# Patient Record
Sex: Male | Born: 1950 | Race: White | Hispanic: No | Marital: Married | Smoking: Former smoker
Health system: Southern US, Community
[De-identification: ages and names within clinical notes are randomized; demographics above are authoritative.]

## PROBLEM LIST (undated history)

## (undated) DIAGNOSIS — I10 Essential (primary) hypertension: Secondary | ICD-10-CM

## (undated) DIAGNOSIS — E785 Hyperlipidemia, unspecified: Secondary | ICD-10-CM

## (undated) DIAGNOSIS — Z951 Presence of aortocoronary bypass graft: Secondary | ICD-10-CM

## (undated) DIAGNOSIS — I2119 ST elevation (STEMI) myocardial infarction involving other coronary artery of inferior wall: Secondary | ICD-10-CM

## (undated) DIAGNOSIS — J302 Other seasonal allergic rhinitis: Secondary | ICD-10-CM

## (undated) DIAGNOSIS — H8109 Meniere's disease, unspecified ear: Secondary | ICD-10-CM

## (undated) DIAGNOSIS — I2510 Atherosclerotic heart disease of native coronary artery without angina pectoris: Secondary | ICD-10-CM

## (undated) DIAGNOSIS — Z9861 Coronary angioplasty status: Secondary | ICD-10-CM

## (undated) DIAGNOSIS — I255 Ischemic cardiomyopathy: Principal | ICD-10-CM

## (undated) HISTORY — DX: Coronary angioplasty status: Z98.61

## (undated) HISTORY — DX: Ischemic cardiomyopathy: I25.5

## (undated) HISTORY — DX: Meniere's disease, unspecified ear: H81.09

## (undated) HISTORY — DX: Atherosclerotic heart disease of native coronary artery without angina pectoris: I25.10

## (undated) HISTORY — PX: CORONARY ARTERY BYPASS GRAFT: SHX141

## (undated) HISTORY — DX: Other seasonal allergic rhinitis: J30.2

## (undated) HISTORY — DX: Hyperlipidemia, unspecified: E78.5

## (undated) HISTORY — DX: Essential (primary) hypertension: I10

## (undated) HISTORY — DX: ST elevation (STEMI) myocardial infarction involving other coronary artery of inferior wall: I21.19

## (undated) HISTORY — DX: Presence of aortocoronary bypass graft: Z95.1

---

## 1990-10-03 DIAGNOSIS — I255 Ischemic cardiomyopathy: Secondary | ICD-10-CM

## 1990-10-03 HISTORY — DX: Ischemic cardiomyopathy: I25.5

## 1990-10-31 DIAGNOSIS — I10 Essential (primary) hypertension: Secondary | ICD-10-CM

## 1990-10-31 DIAGNOSIS — I2510 Atherosclerotic heart disease of native coronary artery without angina pectoris: Secondary | ICD-10-CM | POA: Insufficient documentation

## 1990-10-31 HISTORY — DX: Essential (primary) hypertension: I10

## 1998-09-19 ENCOUNTER — Encounter: Payer: Self-pay | Admitting: Emergency Medicine

## 1998-09-19 ENCOUNTER — Inpatient Hospital Stay (HOSPITAL_COMMUNITY): Admission: EM | Admit: 1998-09-19 | Discharge: 1998-09-30 | Payer: Self-pay | Admitting: Emergency Medicine

## 1998-10-26 ENCOUNTER — Ambulatory Visit (HOSPITAL_COMMUNITY): Admission: RE | Admit: 1998-10-26 | Discharge: 1998-10-26 | Payer: Self-pay | Admitting: Cardiology

## 1998-10-26 ENCOUNTER — Encounter: Payer: Self-pay | Admitting: Cardiology

## 1999-04-13 ENCOUNTER — Encounter: Payer: Self-pay | Admitting: Cardiology

## 1999-04-13 ENCOUNTER — Ambulatory Visit (HOSPITAL_COMMUNITY): Admission: RE | Admit: 1999-04-13 | Discharge: 1999-04-13 | Payer: Self-pay | Admitting: Cardiology

## 2004-04-04 DIAGNOSIS — I2510 Atherosclerotic heart disease of native coronary artery without angina pectoris: Secondary | ICD-10-CM

## 2004-04-04 HISTORY — PX: OTHER SURGICAL HISTORY: SHX169

## 2004-04-04 HISTORY — DX: Atherosclerotic heart disease of native coronary artery without angina pectoris: I25.10

## 2004-11-28 ENCOUNTER — Ambulatory Visit (HOSPITAL_BASED_OUTPATIENT_CLINIC_OR_DEPARTMENT_OTHER): Admission: RE | Admit: 2004-11-28 | Discharge: 2004-11-28 | Payer: Self-pay | Admitting: Otolaryngology

## 2004-12-06 ENCOUNTER — Ambulatory Visit: Payer: Self-pay | Admitting: Internal Medicine

## 2005-03-14 ENCOUNTER — Encounter: Admission: RE | Admit: 2005-03-14 | Discharge: 2005-03-14 | Payer: Self-pay | Admitting: Cardiology

## 2005-03-15 ENCOUNTER — Encounter: Admission: RE | Admit: 2005-03-15 | Discharge: 2005-03-15 | Payer: Self-pay | Admitting: Otolaryngology

## 2005-03-18 ENCOUNTER — Ambulatory Visit (HOSPITAL_COMMUNITY): Admission: RE | Admit: 2005-03-18 | Discharge: 2005-03-18 | Payer: Self-pay | Admitting: *Deleted

## 2005-03-18 HISTORY — PX: CORONARY ANGIOPLASTY WITH STENT PLACEMENT: SHX49

## 2005-04-04 HISTORY — PX: CARDIAC CATHETERIZATION: SHX172

## 2005-04-19 ENCOUNTER — Inpatient Hospital Stay (HOSPITAL_COMMUNITY): Admission: RE | Admit: 2005-04-19 | Discharge: 2005-04-24 | Payer: Self-pay | Admitting: Cardiothoracic Surgery

## 2005-05-09 ENCOUNTER — Encounter: Admission: RE | Admit: 2005-05-09 | Discharge: 2005-05-09 | Payer: Self-pay | Admitting: Cardiology

## 2006-02-02 DIAGNOSIS — Z951 Presence of aortocoronary bypass graft: Secondary | ICD-10-CM

## 2006-02-02 HISTORY — DX: Presence of aortocoronary bypass graft: Z95.1

## 2007-02-08 HISTORY — PX: NM MYOCAR PERF WALL MOTION: HXRAD629

## 2007-04-19 ENCOUNTER — Ambulatory Visit (HOSPITAL_BASED_OUTPATIENT_CLINIC_OR_DEPARTMENT_OTHER): Admission: RE | Admit: 2007-04-19 | Discharge: 2007-04-19 | Payer: Self-pay | Admitting: Orthopedic Surgery

## 2007-07-08 IMAGING — CR DG CHEST 2V
2 series · 2 of 2 positions shown · non-contrast
Comparison: None.

CLINICAL DATA: Pre-cardiac catheterization. Chest pain and shortness of breath.
Prior coronary artery stenting.

CHEST - 2 VIEW  03/14/2005:

[view not recorded (1 of 2)]
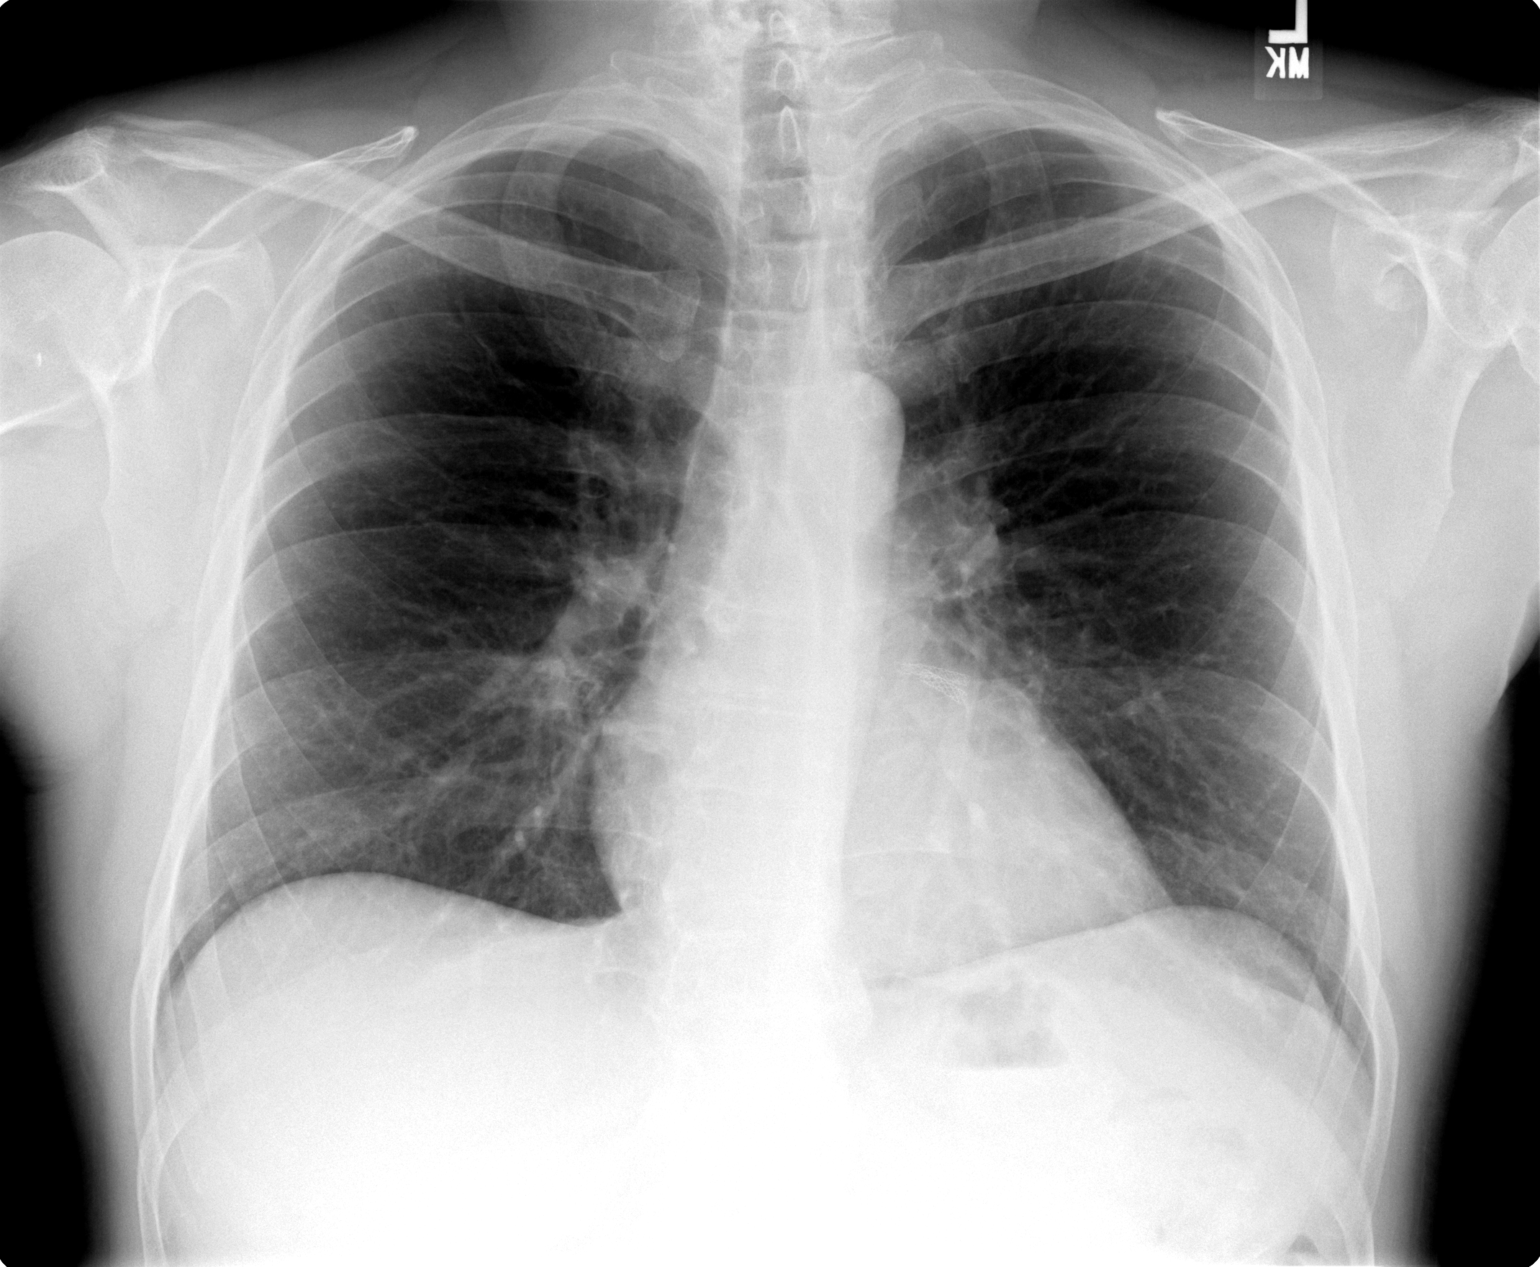

[view not recorded (2 of 2)]
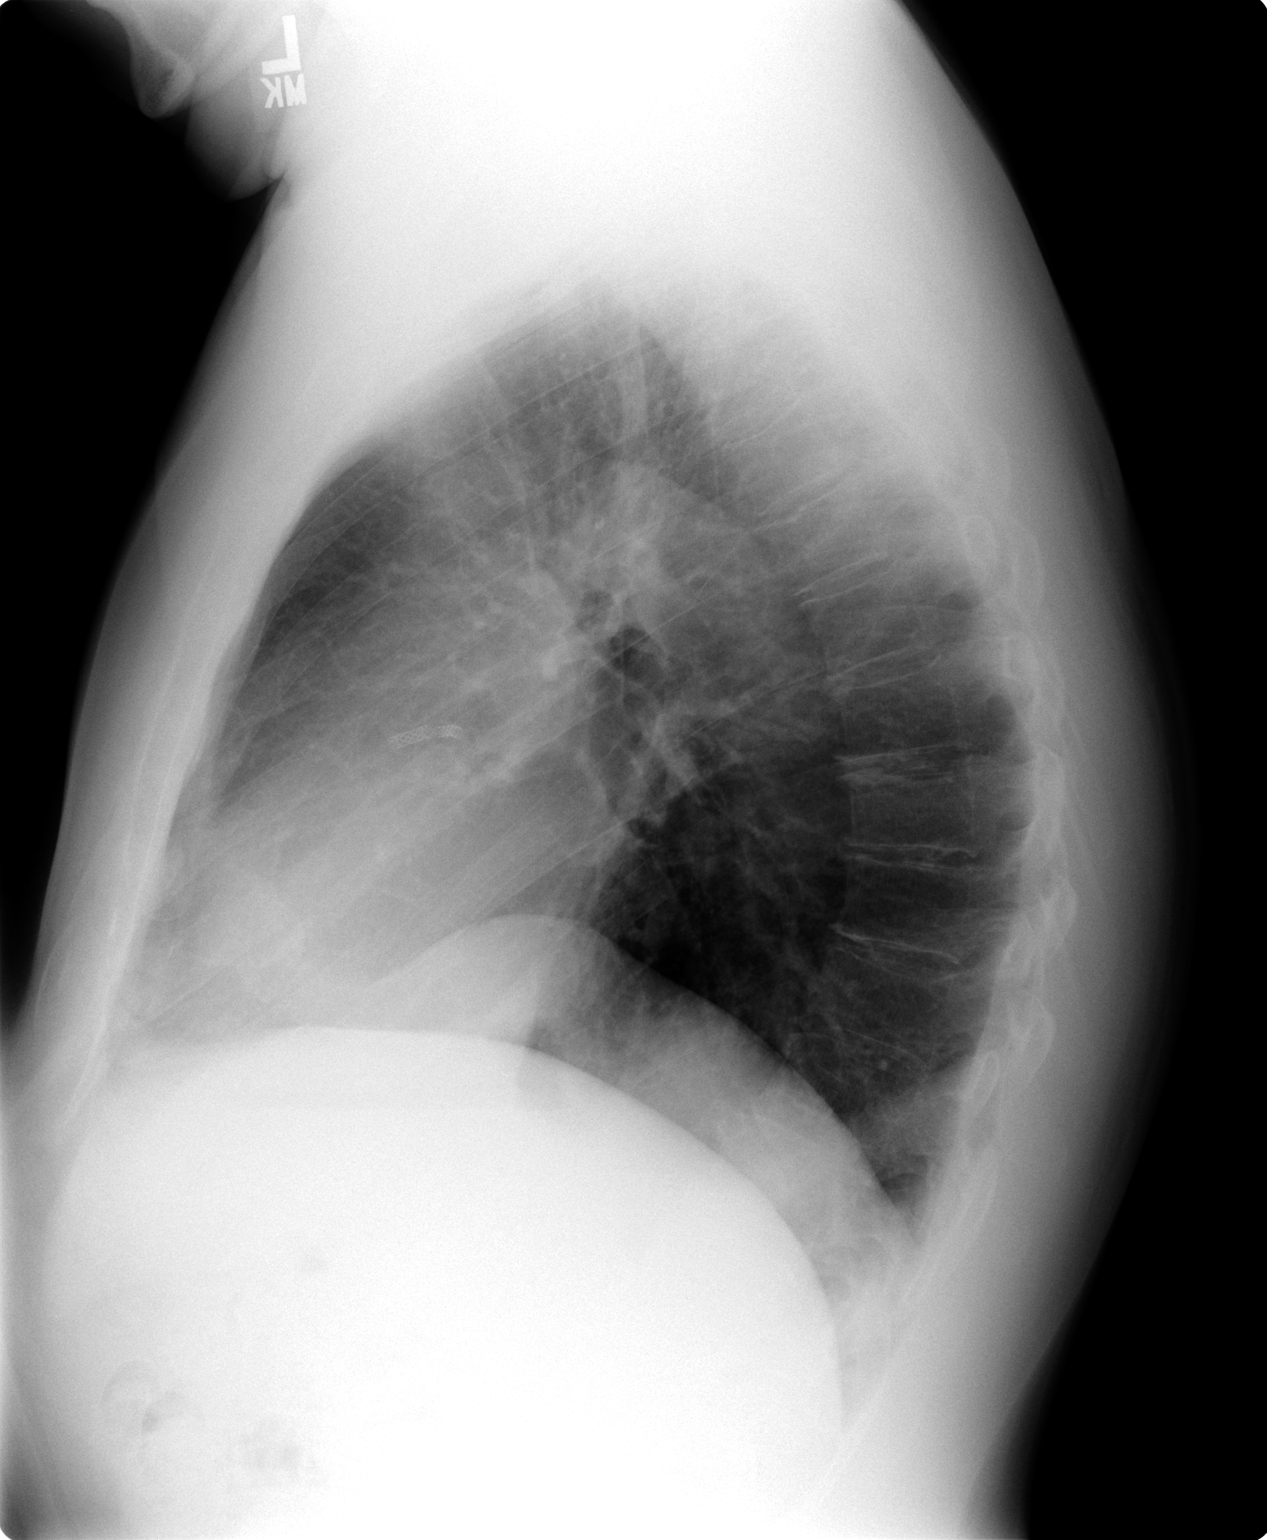

[2 of 2 positions shown; findings below may reference images not displayed]

FINDINGS: The heart size is normal. Stents are present in what I believe are
the circumflex and left anterior descending arteries. The lungs are clear. There
are no pleural effusions. Degenerative changes are present in the thoracic
spine.
IMPRESSION: No acute cardiopulmonary disease.

## 2007-07-09 IMAGING — CR DG ORBITS FOR FOREIGN BODY
2 series · 2 of 2 positions shown · non-contrast
Comparison: none

CLINICAL DATA: Metal removed years ago, pre MRI. 
 ORBITS: 
 Views of the orbits were obtained with the patient looking to the left and looking to the right.  No orbital metallic foreign body is seen.  No bony abnormality is noted.

[view not recorded (1 of 2)]
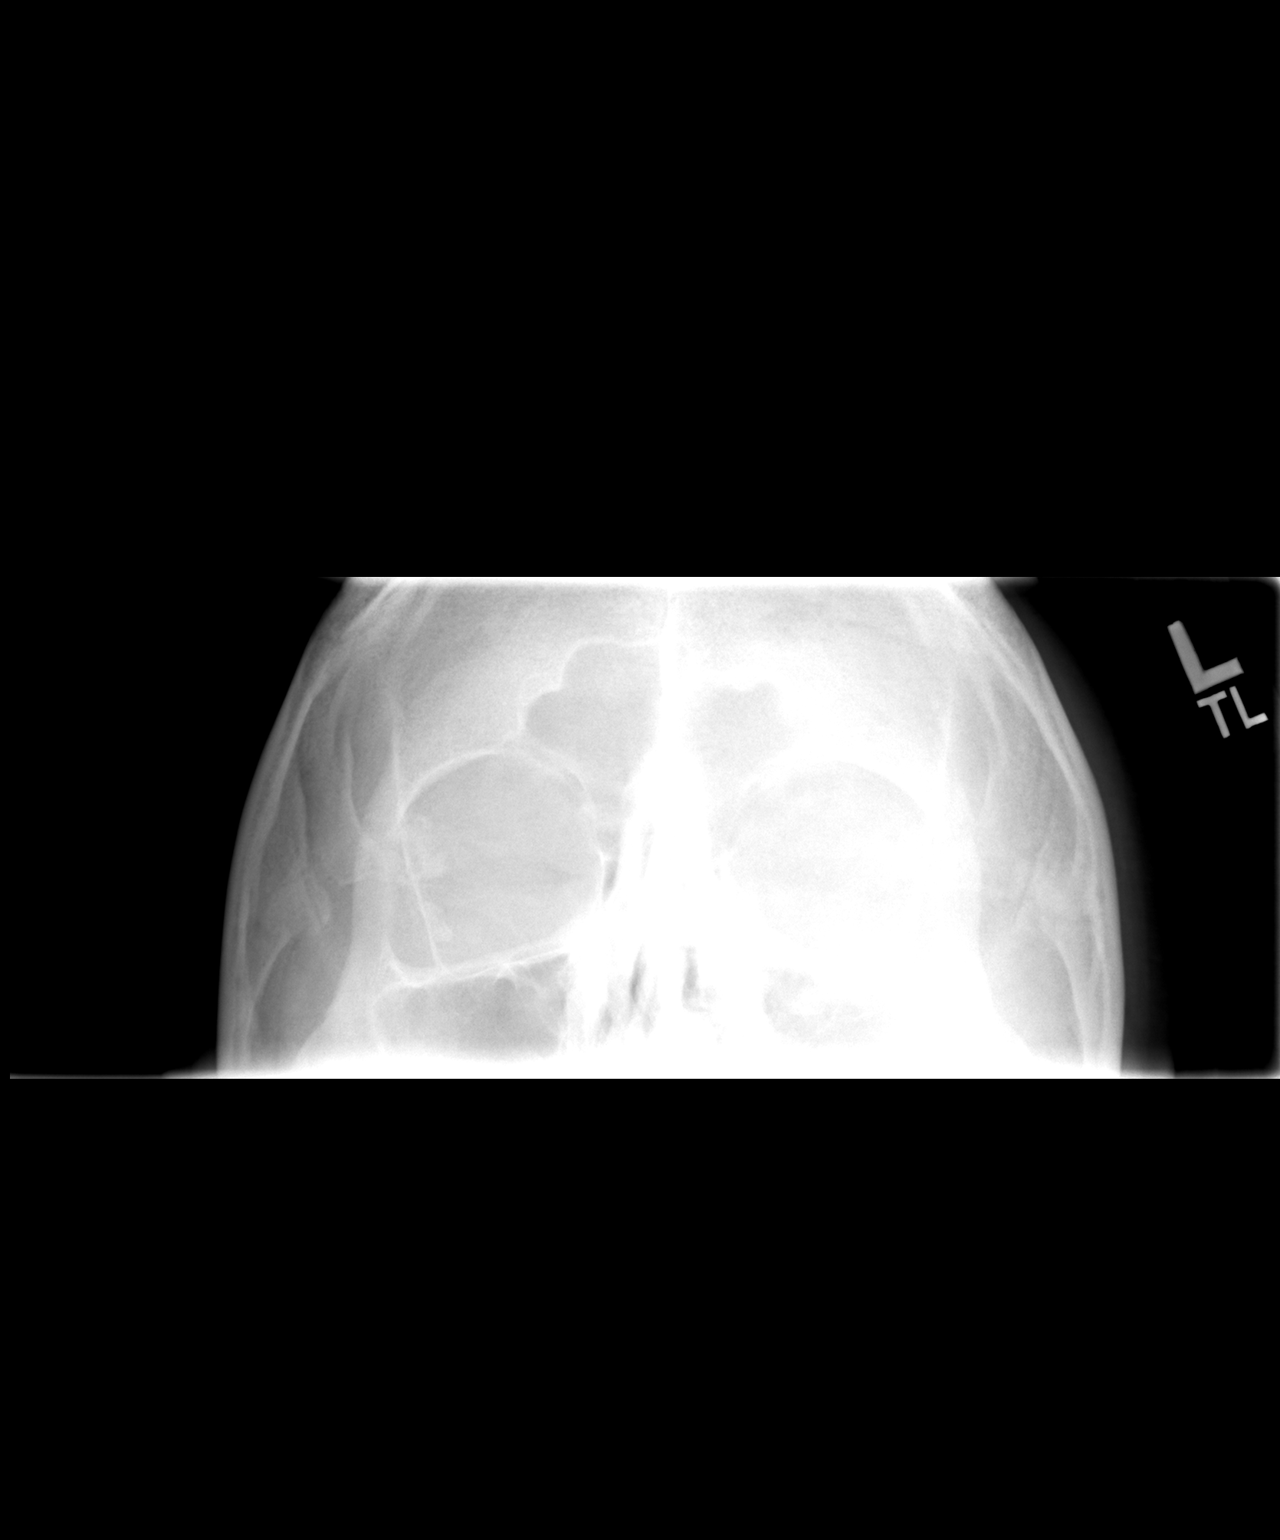

[view not recorded (2 of 2)]
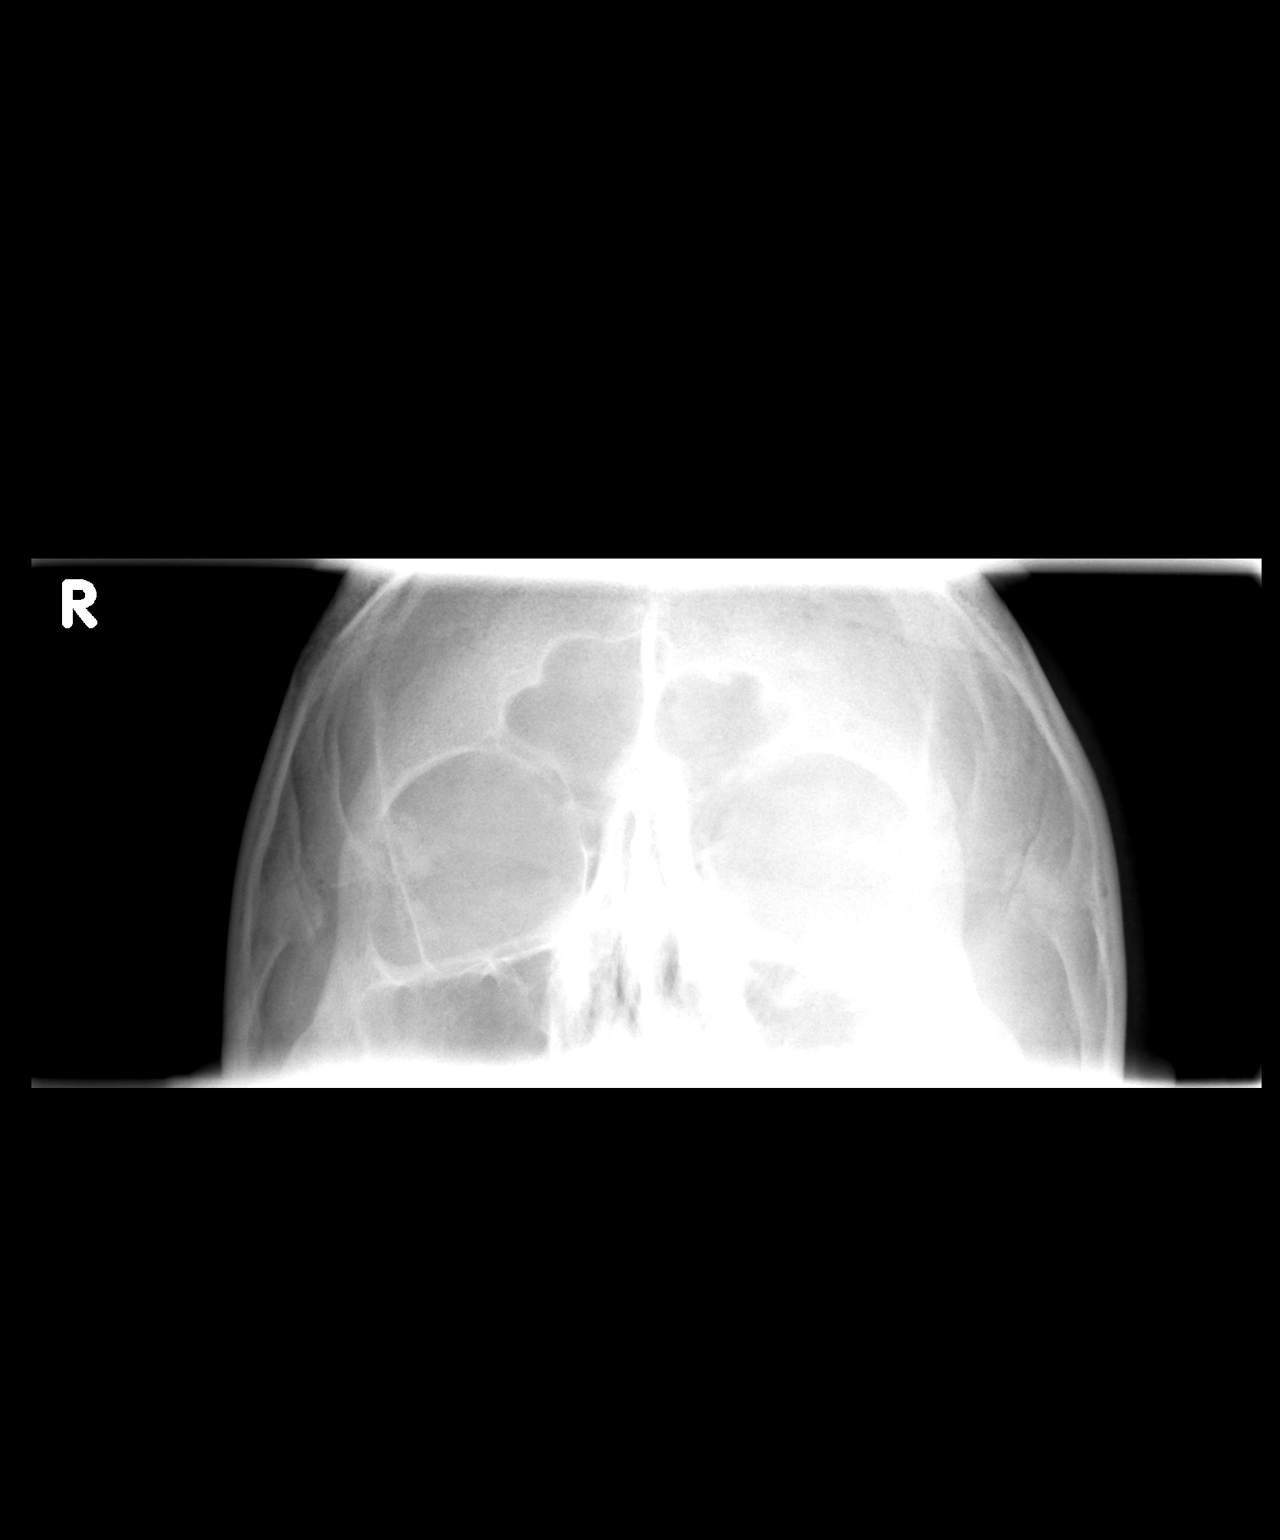

[2 of 2 positions shown; findings below may reference images not displayed]

IMPRESSION: No orbital metallic foreign body.

## 2009-04-28 ENCOUNTER — Encounter: Admission: RE | Admit: 2009-04-28 | Discharge: 2009-04-28 | Payer: Self-pay | Admitting: Sports Medicine

## 2010-04-25 ENCOUNTER — Encounter: Payer: Self-pay | Admitting: Sports Medicine

## 2010-08-17 NOTE — Op Note (Signed)
NAME:  Ricky Ali, Ricky Ali NO.:  [id number]   MEDICAL RECORD NO.:  [id number]          PATIENT TYPE:  AMB   LOCATION:  DSC                          FACILITY:  [facility]   PHYSICIAN:  Loreta Ave, M.D. DATE OF BIRTH:  1951-01-23   DATE OF PROCEDURE:  04/19/2007  DATE OF DISCHARGE:                               OPERATIVE REPORT   PREOPERATIVE DIAGNOSIS:  Left shoulder degenerative arthritis,  subacromial impingement, partial tearing rotator cuff, distal clavicle  osteolysis, loose bodies in the shoulder.   POSTOPERATIVE DIAGNOSIS:  Left shoulder degenerative arthritis,  subacromial impingement, partial tearing rotator cuff, distal clavicle  osteolysis, loose bodies in the shoulder.  Grade 3 and 4 degenerative  joint disease glenohumeral joint, marked complex degenerative tearing  labrum.  Extensive intra-articular portion of biceps tendon tearing.  Two large osteochondral loose bodies, one almost a centimeter and a half  in diameter.  Tendinopathy but no full-thickness tears rotator cuff.  Subacromial bursitis, impingement distal clavicle osteolysis.   PROCEDURE:  Left shoulder exam under anesthesia, arthroscopy with  debridement glenohumeral joint including articular cartilage and labrum.  Removal of two large loose bodies with large grasping forceps.  Debridement rotator cuff.  Resection of intra-articular portion biceps  tendon for later tenodesis.  Subacromial decompression with  acromioplasty, distal clavicle excision and coracoacromial ligament  release.  Open biceps reimplantation interbicipital groove with  FiberWire suture.   SURGEON:  Loreta Ave, M.D.   ASSISTANT:  Genene Churn. Denton Meek., present throughout the entire case.   ANESTHESIA:  General.   BLOOD LOSS:  Minimal.   SPECIMENS:  None.   COMPLICATIONS:  None.   DRESSING:  Soft compressive sling.   PROCEDURE:  The patient brought to the operating room, placed on the  operating table in  supine position.  After adequate anesthesia had been  obtained, left shoulder examined.  Reasonable not quite full motion, but  consistent with degenerative arthritis.  No adhesions.  Placed in beach-  chair position on a shoulder positioner and prepped and draped in the  usual sterile fashion.  Three portals anterior posterior lateral.  Shoulder entered with blunt obturator.  Arthroscope introduced.  The  shoulder distended and inspected.  Extensive grade 3 and 4 changes.  Loose bodies removed.  Circumferential tearing labrum debrided.  Although tendinopathy of the supraspinatus, infraspinatus and subscap  tendon, there were no full-thickness tears.  Intra-articular portion of  biceps was markedly degenerative and essentially just about to tear off.  The intra-articular portion debrided for later extra-articular  tenodesis.  Two large loose bodies were then removed from the large  grasping forceps and brought out from the shoulder.  Cannula redirected  subacromially.  Type 3 acromion.  Reactive bursitis.  Abrasive changes  on top of the cuff but no full-thickness tears.  Cuff debrided.  Acromioplasty to a type 1 acromion.  CA ligament release.  Distal  clavicle grade 4 changes.  Periarticular spurs lateral centimeter of  clavicle resected.  Adequacy of decompression and debridement confirmed  viewing from all portals.  Instruments and fluid removed.  Incision was  made over the anterior aspect of the shoulder, deltoid splitting,  staying above the nerve.  Blunt dissection used to open up the deltoid  exposing the bicipital groove.  Biceps tendon brought out,  tear cut off  to appropriate length and then captured with a weave FiberWire suture.  Reimplanted into the middle of the bicipital groove and large drill hole  with the suture brought in that and then out two small exiting drill  holes.  They were pulled tightly bringing the biceps down into a large  hole and then sutures tied over  top to ensure reimplantation.  Wound  irrigated.  Deltoid closed with Vicryl.  Portals closed with nylon.  Incision closed subcutaneous subcuticular Vicryl.  Steri-Strips applied.  Sterile compressive dressing applied.  Sling applied.  Anesthesia  reversed.  Brought to the recovery room.  Tolerated surgery well.  No  complications.      Loreta Ave, M.D.  Electronically Signed     DFM/MEDQ  D:  04/19/2007  T:  04/19/2007  Job:  [id number]

## 2010-08-20 NOTE — Op Note (Signed)
NAME:  Ricky Ali, Ricky Ali NO.:  [id number]   MEDICAL RECORD NO.:  [id number]          PATIENT TYPE:  INP   LOCATION:  2001                         FACILITY:  [facility]   PHYSICIAN:  Sheliah Plane, MD    DATE OF BIRTH:  1950-08-14   DATE OF PROCEDURE:  04/19/2005  DATE OF DISCHARGE:                                 OPERATIVE REPORT   PREOPERATIVE DIAGNOSIS:  Recurrent coronary occlusive disease, status post  coronary stent placement.   POSTOPERATIVE DIAGNOSIS:  Recurrent coronary occlusive disease, status post  coronary stent placement.   SURGICAL PROCEDURES:  Coronary artery bypass grafting x5 with the left  internal mammary to left anterior descending coronary artery, right radial  artery to the diagonal coronary artery, right internal mammary to the first  obtuse marginal via the transverse sinus as a pedicle graft, sequential  reversed saphenous vein graft to the posterior descending and posterolateral  branches of the right coronary artery with right thigh endo-vein harvesting.   SURGEON:  Sheliah Plane, MD   FIRST ASSISTANT:  Gershon Crane, [place]   BRIEF HISTORY:  The patient is a 60 year old male who at age 82 suffered an  inferior myocardial infarction and was treated with angioplasty of the right  coronary artery.  Subsequently, he returned with disease in the LAD and a  large first diagonal.  Stents were placed in these vessels in the past.  However, the patient began having recurrent symptoms.  Repeat cardiac  catheterization revealed significant disease, restenosis both stents,  progression of disease in a large first obtuse marginal of greater than 80%.  He had diffuse disease throughout the right coronary artery with evidence of  inferior hypokinesis.  Redo coronary artery bypass grafting was recommended  to the patient, who agreed and signed informed consent.   DESCRIPTION OF PROCEDURE:  With Swan-Ganz and arterial line monitoring  placed, the patient  underwent general endotracheal anesthesia without  incidence.  The skin of the chest and legs was prepped with Betadine and  draped in usual sterile manner.  The patient is left-handed and had a patent  palmar arch on the right.  It was decided to harvest the right internal  mammary artery and also the left internal mammary artery and the radial  artery on the right.  Initially, using a curvilinear incision across the  volar aspect of the right forearm, dissection was carried down to the radial  artery, which was identified and dissected open with an open technique to  just proximal to the antecubital fossa.  The artery appeared to be of good  quality and caliber and using a harmonic scalpel, the side branches of the  radial artery were each divided.  Prior to dividing the artery, 2500 units  of heparin were administered to the patient.  The artery was divided and  flushed with warm heparinized blood.  The radial artery was excellent  quality.  Proximally and distally, the artery was ligated with silk sutures.  The arm was then closed with a running 2-0 Ethibond suture and a 4-0  subcuticular stitch in the skin edges.  Dry dressing was applied and the arm  was tucked.  A median sternotomy was performed and both the right and left  internal mammary arteries were dissected down as pedicle grafts and were of  good quality and caliber.  Using a [employer] endo-vein harvesting system, a  segment of vein was harvested from the right thigh and was slightly small,  but of good quality.  The pericardium was then opened.  The patient was  systemically heparinized.  The ascending aorta and the right atrium were  cannulated and aortic root vent cardioplegia needle was introduced into the  ascending aorta.  On examination of the heart, the patient had evidence of  old inferior scarring consistent with old inferior myocardial infarction.  His body temperature was cooled to 30 degrees, aortic crossclamp was  applied  and 500 mL of cold blood potassium cardioplegia was administered with rapid  diastolic arrest the heart.  Attention was turned first to the right system.  The PD the PL were identified.  Each was opened and were small, diffusely  diseased vessels, but did admit 1-mm probes.  A diamond-type side-to-side  anastomosis was carried out with a running 8-0 Prolene.  Using a running 8-0  Prolene, distal anastomosis was performed.  Additional cold blood  cardioplegia was administered down the vein graft.  Attention was then  turned to the first obtuse marginal vessel.  The right internal mammary  artery was brought through the transverse sinus behind the aorta and easily  reached to the obtuse marginal vessel.  The vessel was opened and admitted a  1.5-mm probe.  Using a running 8-0 Prolene, the right internal mammary  artery was anastomosed to the obtuse marginal coronary artery.  The bulldog  on the mammary artery was flashed and there was no evidence of anastomotic  bleeding. The right radial arterywas used to bypass the large first diagonal  vessel.  This vessel was opened and was 1.5 mm in size.  Using a running 8-0  Prolene, the radial artery was anastomosed to the diagonal coronary artery.  The left internal mammary artery was then anastomosed to left anterior  descending coronary artery, which was opened in the mid portion and was a  reasonable-sized vessel.  It admitted a 1.5-mm probe distally.  Using a  running 8-0 Prolene, the left internal mammary artery was anastomosed to the  left anterior descending coronary artery.  With the crossclamp still in  place, 2 punch aortotomies were created on the ascending aorta.  The radial  artery was trimmed to the appropriate length and anastomosed directly to the  ascending aorta with a running 7-0 Prolene.  The vein graft to the right cornory system was anastomosed to the acending aorta.  Air was evacuated  from the heart and from the  ascending aorta and the aortic crossclamp was  removed with total crossclamp time 119 minutes.  The patient returned to a  sinus rhythm.  Atrial and ventricular pacing wires were applied.  Prior to  removal of the crossclamp, the bulldogs on the mammary arteries had been  removed and there was evidence of prompt rise in myocardial septal  temperature.  With the patient's body temperature rewarmed to 37 degrees, he  was then ventilated and weaned from bypass. Protamine was administrated.  Whith the operative field hemostatic,Two atrial and 2 ventricular pacing  wires were applied, graft markers applied, left and right pleural tubes, 2  mediastinal tubes.  Pericardium was reapproximated.  Sternum was closed with  #  6 stainless steel wire, fascia closed with interrupted 0 Vicryl and 3-0  Vicryl in subcutaneous tissue, 4-0 subcuticular stitch in the skin edges.  Dry dressings were applied.  Sponge and needle count was reported as correct  at completion of the procedure.  The patient tolerated the procedure without  obvious complication and was transferred to the surgical intensive care unit  for further postoperative care.  He tolerated the procedure without obvious  complication.  Total pump time was 163 minutes.      Sheliah Plane, MD  Electronically Signed     EG/MEDQ  D:  04/22/2005  T:  04/23/2005  Job:  [id number]   cc:   Thereasa Solo. Little, M.D.  Fax: [phone]

## 2010-08-20 NOTE — Procedures (Signed)
NAME:  Ricky Ali, Ricky Ali NO.:  [id number]   MEDICAL RECORD NO.:  [id number]          PATIENT TYPE:  OUT   LOCATION:  SLEEP CENTER                 FACILITY:  [facility]   PHYSICIAN:  Clinton D. Maple Hudson, M.D. DATE OF BIRTH:  23-Aug-1950   DATE OF STUDY:  11/28/2004                              NOCTURNAL POLYSOMNOGRAM   REFERRING PHYSICIAN:  Zola Button T. [facility], M.D.   Victorio Palm OF STUDY:  November 28, 2004.   INDICATIONS FOR STUDY:  Hypersomnia with sleep apnea. Epworth sleepiness  score 05/24, BMI 25. Weight 180 pounds.   SLEEP ARCHITECTURE:  Total sleep time 323 minutes. Sleep efficiency 85%.  Stage I 5%, stage II 58%, stages  III and IV 27%, REM 9% of total sleep  time. Sleep latency 2 minutes, REM latency 101 minutes, awake after sleep  onset 55 minutes, arousal index increased at 38. No bedtime medication  taken.   RESPIRATORY DATA:  Apnea hypopnea index (AHI, RDI) 4.3 obstructive events  per hour which is within normal adult limits. There were 9 obstructive  apneas, 3 central apneas, and 11 hypopneas. Events were not positional. REM  AHI was 12.2 per hour.   OXYGEN DATA:  Moderate to loud snoring with oxygen desaturation to a nadir  of 87%. Mean oxygen saturation through the study was 95% on room air.   CARDIAC DATA:  Normal sinus rhythm with occasional PVCs.   MOVEMENT/PARASOMNIA:  A total of 167 limb jerks were reported of which 106  were associated with arousal or awakening for periodic limb movement with  arousal index of 19.7 per hour which is abnormal.   IMPRESSION/RECOMMENDATIONS:  1.  Occasional sleep disordered breathing events, AHI 4.3 per hour which is      within normal limits with moderately loud snoring and oxygen      desaturation briefly to 87%.  2.  Periodic limb movement with arousal, 19.7 per hour. Consider specific      therapy such as clonazepam or Requip if clinically appropriate.     Clinton D. Maple Hudson, M.D.  Diplomate, Biomedical engineer of Sleep  Medicine  Electronically Signed    CDY/MEDQ  D:  12/05/2004 12:05:01  T:  12/05/2004 17:42:05  Job:  [id number]

## 2010-08-20 NOTE — Discharge Summary (Signed)
NAME:  FAHD, GALEA NO.:  [id number]   MEDICAL RECORD NO.:  [id number]          PATIENT TYPE:  INP   LOCATION:  2001                         FACILITY:  [facility]   PHYSICIAN:  Sheliah Plane, MD    DATE OF BIRTH:  Nov 25, 1950   DATE OF ADMISSION:  04/19/2005  DATE OF DISCHARGE:                                 DISCHARGE SUMMARY   ADMISSION DIAGNOSIS:  Recurrent angina.   PAST MEDICAL HISTORY AND DISCHARGE DIAGNOSES:  1.  Meniere's disease.  2.  Hypertension.  3.  Hyperlipidemia.  4.  Coronary artery disease, status post myocardial infarction in 1992,      status post multiple angioplasty and stents, status post coronary artery      bypass grafting x5.  5.  Postoperative anemia.  6.  Postoperative thrombocytopenia with a negative HIT screen.   PAST SURGICAL HISTORY:  Status post left shoulder surgery.   ALLERGIES:  No known drug allergies.   BRIEF HISTORY:  The patient is a 60 year old male who has been followed by  Dr. Clarene Duke with a known history of coronary occlusive disease.  This history  began in 1992, at which time he had an acute myocardial infarction involving  the right coronary artery.  He underwent angioplasty at that time and the  vessel was reopened again in June 2000 with a stent.  The patient presented  with recurrent angina and had four additional stents placed in three  arteries over a two-day period.  The patient did well until he noted  recurrent anginal symptoms occasionally at rest in July and again in  November.  Secondary to his recurrent symptoms, another cardiac  catheterization was performed on April 19, 2005, by Dr. Jenne Campus, which  revealed significant three-vessel coronary artery disease which progressed  in spite of placement of stents.  In light of this, the patient was referred  to CVTS.  Dr. Tyrone Sage evaluated the patient in the office, and it was his  opinion that the patient should proceed with surgical  revascularization.   HOSPITAL COURSE:  The patient was admitted and taken to the OR on April 19, 2005, for coronary artery bypass grafting x5.  The left internal mammary  artery was grafted to the LAD, the right internal mammary artery was grafted  to OM-1, the right radial artery was grafted to the diagonal, and a  saphenous vein was grafted in sequence to the PD and PL.  Endoscopic graft  harvesting was performed via the right thigh.  The patient tolerated the  procedure well and was hemodynamically stable immediately postoperatively.  The patient was transferred from the OR to the SICU in stable condition.  The patient was extubated without complications and woke up from anesthesia  neurologically intact.   The patient's postoperative course has been relatively uneventful.  On  postoperative day #1, he was afebrile with stable vital signs and  maintaining a normal sinus rhythm.  All invasive lines and chest tubes were  discontinued in a routine manner and the patient tolerated this well.  The  patient has been volume-overloaded postoperatively and has  been diuresed  accordingly.  On postoperative day 1, the patient was noted to be anemic.  This has been monitored closely with daily CBC levels.  He has subsequently  been started on iron and folic acid, and these will continue to be  monitored.  The patient has also been noted to be thrombocytopenic.  He is  currently trying to avoid any transfusion of blood products.  These are  currently stable at this time and, again, are being monitored closely.   The patient began cardiac rehab on postoperative day #1 and initially did  not tolerate this well; however, he is continuing to progress and is doing  quite well at this time.  Secondary to the patient's thrombocytopenia, a HIT  screen was performed and this was found to be negative.   On postoperative day #3, the patient is without complaints.  He is afebrile  with stable vital signs  and maintaining a normal sinus rhythm.  His chest x-  ray reveals a small left pleural effusion.  On physical exam, cardiac is  regular rate and rhythm.  The lungs reveal decreased breath sounds at the  bases.  The abdomen is benign.  The incisions are clean, dry, and intact,  and there is mild edema present in the extremities.  Motor and sensation are  intact in the right arm.  There is mild ecchymosis present in both the right  arm and right lower extremity.  The patient is in stable condition at this  time and as long as he continues to progress and this thrombocytopenia and  anemia remain stable, the patient will be ready for discharge within the  next one to two days pending morning round reevaluation.   LABORATORY DATA:  CBC and BMP on April 22, 2005:  White count 5.6,  hemoglobin 7.9, hematocrit 22.2, platelets 72.  Sodium 135, potassium 4.3,  BUN 9, creatinine 0.8, glucose 99.  HIT screen negative.   CONDITION ON DISCHARGE:  Improved.   INSTRUCTIONS:   MEDICATIONS:  1.  Aspirin 81 mg daily.  2.  Toprol XL 25 mg daily.  3.  Vytorin 10/20 mg daily.  4.  Imdur 30 mg daily.  5.  Xanax 0.25 mg q.6h. p.r.n. anxiety.  6.  Folic acid 1 mg daily.  7.  Niferex 150 mg daily.  8.  Tylox one to two q.6h. p.r.n. pain.   ACTIVITY:  No driving, no lifting more than 10 pounds for three weeks, and  the patient should continue his daily breathing and walking exercises   DIET:  Low salt, low fat.   WOUND CARE:  The patient may shower daily and clean the incisions with soap  and water.  If wound problems arise, he should contact the CVTS office.   FOLLOW-UP:  1.  Appointment with Dr. Clarene Duke.  The patient should contact his office for      an appointment two weeks after discharge, at which time he will have a      chest x-ray taken.  2.  With Dr. Laneta Simmers for Dr. Tyrone Sage on May 17, 2005, at 11:15 a.m.      Pecola Leisure, [place]      Sheliah Plane, MD  Electronically  Signed   AY/MEDQ  D:  04/22/2005  T:  04/23/2005  Job:  [id number]   cc:   Thereasa Solo. Little, M.D.  Fax: [phone]

## 2010-08-20 NOTE — Cardiovascular Report (Signed)
NAME:  Ricky Ali, Ricky Ali NO.:  [id number]   MEDICAL RECORD NO.:  [id number]          PATIENT TYPE:  OIB   LOCATION:  2860                         FACILITY:  [facility]   PHYSICIAN:  Darlin Priestly, MD  DATE OF BIRTH:  1950-04-28   DATE OF PROCEDURE:  03/18/2005  DATE OF DISCHARGE:  03/18/2005                              CARDIAC CATHETERIZATION   PROCEDURE:  1.  Left heart catheterization.  2.  Coronary angiography.  3.  Left ventriculogram.   ATTENDING PHYSICIAN:  Darlin Priestly, M.D.   COMPLICATIONS:  None.   INDICATIONS FOR PROCEDURE:  Mr. Tantillo is a 60 year old male patient of Dr.  Julieanne Manson and Dr. Dara Hoyer, with a history of CAD status post PTCA  and stenting of a high first diagonal, proximal LAD, and multiple stents of  the RCA.  His last cath in June 2000 was stent of the LAD as well as  multiple stents of the RCA.  He recently saw Dr. Clarene Duke with a complaint of  crescendo angina similar to his previous chest pain.  He is now referred for  repeat catheterization.   DESCRIPTION OF PROCEDURE:  After giving informed written consent, the  patient was brought to the cardiac cath lab were the right and left groins  were shaved, prepped and draped in a sterile fashion.  ECG monitoring was  established.  Using modified Seldinger technique, a 6 French arterial sheath  was inserted in the right femoral artery.  6 French diagnostic catheters  were used to perform diagnostic angiography.   The left main is a medium size vessel with distal 20% narrowing.   The LAD is a medium size vessel which gives rise to one high diagonal  branch.  The LAD is noted to have a stent in its proximal segment.  There  appears to be 50-60% in stent restenosis with haziness in the stented  segment.  There was a 70% lesion just beyond the LAD stent.  The remainder  of LAD does not appear to have any significant disease.  The high first  diagonal also has a stent in its  proximal portion which, as well, appears to  have approximately 70% in stent restenosis with a diffuse haziness.  There  is an 80% lesion just beyond that stent.   The left coronary artery gives rise to a medium size ramus intermedius with  50-60% proximal disease.   The left circumflex is a medium size vessel which courses to the AV groove  and gives rise to two obtuse marginal branches.  The AV groove circumflex  has no significant disease.  The first OM is a medium size vessel with 90%  ostial lesion.  The second OM is a small vessel with no significant disease.   The right coronary artery is a large vessel and is dominant and gives rise  to PDA and posterolateral branch.  There is a diffuse aneurysmal dilatation  throughout the RCA with 50% proximal, 70% mid segment lesion.  There was a  stent noted in the mid portion of the RCA with 60% in  stent restenosis which  also appears to be hazy.  There is a stent also noted in the distal portion  of the RCA with 60% in-stent restenosis.  The PDA and posterolateral  branches are small to medium size vessels, both of which have 80% proximal  lesions.   Left ventriculogram reveals mildly depressed EF of 40-45% with mild global  hypokinesis.  There appears to be more pronounced inferior and posterobasal  hypokinesis.   HEMODYNAMICS:  Systemic arterial pressure 140/87, LV systolic pressure  139/10, LVEDP 19.   CONCLUSION:  1.  Significant three vessel coronary artery disease.  2.  Moderately depressed LV systolic function.  3.  Systemic hypertension.  4.  Elevated LVE.      Darlin Priestly, MD  Electronically Signed     RHM/MEDQ  D:  03/18/2005  T:  03/21/2005  Job:  [id number]   cc:   Thereasa Solo. Little, M.D.  Fax: [phone]   Teena Irani. Arlyce Dice, M.D.  Fax: [phone]

## 2010-12-23 LAB — BASIC METABOLIC PANEL
Calcium: 9.5
Creatinine, Ser: 0.87
GFR calc Af Amer: >60
GFR calc non Af Amer: >60

## 2010-12-23 LAB — POCT HEMOGLOBIN-HEMACUE: Hemoglobin: 14.2

## 2012-10-02 DIAGNOSIS — H8109 Meniere's disease, unspecified ear: Secondary | ICD-10-CM

## 2012-10-02 HISTORY — DX: Meniere's disease, unspecified ear: H81.09

## 2012-10-29 ENCOUNTER — Encounter: Payer: Self-pay | Admitting: *Deleted

## 2012-10-30 ENCOUNTER — Ambulatory Visit: Payer: Self-pay | Admitting: Cardiology

## 2012-10-30 ENCOUNTER — Encounter: Payer: Self-pay | Admitting: Cardiology

## 2012-10-30 ENCOUNTER — Ambulatory Visit (INDEPENDENT_AMBULATORY_CARE_PROVIDER_SITE_OTHER): Payer: Managed Care, Other (non HMO) | Admitting: Cardiology

## 2012-10-30 VITALS — BP 90/69 | HR 80 | Ht 68.0 in | Wt 164.0 lb

## 2012-10-30 DIAGNOSIS — I10 Essential (primary) hypertension: Secondary | ICD-10-CM

## 2012-10-30 DIAGNOSIS — I255 Ischemic cardiomyopathy: Secondary | ICD-10-CM

## 2012-10-30 DIAGNOSIS — I2589 Other forms of chronic ischemic heart disease: Secondary | ICD-10-CM

## 2012-10-30 DIAGNOSIS — Z79899 Other long term (current) drug therapy: Secondary | ICD-10-CM

## 2012-10-30 DIAGNOSIS — H8102 Meniere's disease, left ear: Secondary | ICD-10-CM

## 2012-10-30 DIAGNOSIS — I2510 Atherosclerotic heart disease of native coronary artery without angina pectoris: Secondary | ICD-10-CM

## 2012-10-30 DIAGNOSIS — E785 Hyperlipidemia, unspecified: Secondary | ICD-10-CM | POA: Insufficient documentation

## 2012-10-30 DIAGNOSIS — H8109 Meniere's disease, unspecified ear: Secondary | ICD-10-CM | POA: Insufficient documentation

## 2012-10-30 DIAGNOSIS — Z9861 Coronary angioplasty status: Secondary | ICD-10-CM

## 2012-10-30 DIAGNOSIS — R0989 Other specified symptoms and signs involving the circulatory and respiratory systems: Secondary | ICD-10-CM

## 2012-10-30 DIAGNOSIS — R0609 Other forms of dyspnea: Secondary | ICD-10-CM | POA: Insufficient documentation

## 2012-10-30 NOTE — Assessment & Plan Note (Signed)
It's been 6 years since his last nuclear stress test. Been having some dyspnea on exertion, I'm concerned that he may very well as a graft dysfunction. He also has the ramus intermedius that was not bypassed.  Plan: Treadmill Myoview (Myocardial Perfusion Imaging)

## 2012-10-30 NOTE — Assessment & Plan Note (Signed)
Has never had an echocardiogram done. His last EF is roughly 40%.  He is on a pretty regimen of beta blocker and ACE inhibitor. No active heart failure symptoms to require any diuretic. He does however have mild dyspnea on exertion.  Plan: 2-D Echocardiogram, continue beta blocker and ACE inhibitor, however while on triamterene/HCTZ, will have him cut the Lisinopril dose in half versus stopped it due to hypotension.

## 2012-10-30 NOTE — Assessment & Plan Note (Signed)
Currently back on Vytorin along with niacin and fish oil.  Plan: Followup lipid panel, and CMP (chemistries partially due to be on ACE inhibitor and diuretic)

## 2012-10-30 NOTE — Assessment & Plan Note (Signed)
Somewhat concerning in a patient with known coronary disease status post CABG and EF of 40%.  Plan: 2-D Echocardiogram, and Treadmill Myoview Stress Test (Myocardial Perfusion Imaging)

## 2012-10-30 NOTE — Assessment & Plan Note (Signed)
Actually hypotensive today. He probably can't tolerate Maxzide plus the high dose of ACE inhibitor. ACE inhibitor in half, or even stop while on Maxide, then restart once off Maxide for Mnire's disease.

## 2012-10-30 NOTE — Patient Instructions (Addendum)
Your physician recommends that you have  your lab work  CMP, LIPID done  Your physician has requested that you have an echocardiogram. Echocardiography is a painless test that uses sound waves to create images of your heart. It provides your doctor with information about the size and shape of your heart and how well your heart's chambers and valves are working. This procedure takes approximately one hour. There are no restrictions for this procedure.  Your physician has requested that you have en exercise stress myoview. For further information please visit [email or web address] Please follow instruction sheet, as given.   Your physician has requested that you have an echocardiogram. Echocardiography is a painless test that uses sound waves to create images of your heart. It provides your doctor with information about the size and shape of your heart and how well your heart's chambers and valves are working. This procedure takes approximately one hour. There are no restrictions for this procedure.  Your physician recommends that you schedule a follow-up appointment in 12 months if all test are normal ,if test are abnormal will make an an appointment sooner

## 2012-10-30 NOTE — Progress Notes (Addendum)
Patient ID: Ricky Ali, male   DOB: January 16, 1951, 62 y.o.   MRN: [id number]  PCP: Delorse Lek, MD  Clinic Note: Chief Complaint  Patient presents with  . Establish Care    [place] to [facility]  . ROV 1 year    Joint pain in legs when walking, extreme lightheadedness/dizziness-being treated with a very lov sodium diet and diuretic, some vomiting-the Saturday before fathers day due to Menieres Disease   HPI: Ricky Ali is a 62 y.o. male with a PMH below who presents today for 1 year followup is coronary artery disease is a long-term patient of Dr. Caprice Kluver who first met back in 1992 the time the stress masses inferior/posterior MI with emergent PTCA only of his RCA. Since then has had multiple cardiac catheterizations and angioplasties including at least 2 stents placed in the RCA in the mid and distal, proximal diagonal as well as a proximal to mid LAD in 2000. He did relatively well until late 2006 when he came in with unstable angina. Cardiac catheterization at that time revealed significant in-stent restenosis in the most all stents as well as stenosis beyond the stent. He is referred for CABG in January 2007. His only had 1 evaluation of his cardiac function and coronary disease since then with a nuclear stress test in 2008 which showed an EF of roughly 40% with inferior scar and peri-infarct ischemia. Was last seen in 10/12/2011 by Dr. Caprice Kluver. At that time was doing well and stable without any adjustments made.  Interval History: He presents today, doing relatively well overall. He does note that in the last appears his activity tolerance has decreased with some mild dyspnea on exertion. He says he basically gets winded easier than he used to. Doesn't do any regular exercise, and has been much less active. However despite this his a nontraumatic dietary modification program, partly because of his trying to treat Mnire's disease. He is also to significant alcohol use.  His major concern  is that he probably is having with Mnire's, he is extreme dizziness nausea associated with it. Basically just cannot continue to tolerate the restrictions on his diet he has. He also is bothered by the diuretic make him urinate so much. He does note some orthostatic dizziness as well as his Mnire's related disease. This is from going from sitting to standing or from bending over and sitting up again.  Besides mild dyspnea on exertion, he denies any chest pain or rest or exertion. No dyspnea at rest. No PND, orthopnea, or edema. No palpitations, syncope/ near syncope. He denies any TIA or amaurosis fugax symptoms. No claudication symptoms. Does have occasional hemorrhoids but otherwise no melena, hematochezia or hematuria.  Past Medical History  Diagnosis Date  . Hyperlipidemia   . Hypertension   . CAD S/P percutaneous coronary angioplasty 1992-age 27    large inferior MI treated with angioplasty only  . History of: ST elevation myocardial infarction (STEMI) of inferior wall 1992    PTCA of RCA occlusion  . S/P CABG x 5   . Seasonal allergies   . Cardiomyopathy, ischemic 10/31/1990    Last reported EF of roughly 40% by Myoview stress test in 2008:   Marland Kitchen Meniere's disease July 2014     recurrent    Low normal testosterone level  Prior Cardiac Evaluation and Past Surgical History: Past Surgical History  Procedure Laterality Date  . Nm myocar perf wall motion  02/08/2007    EF 43% ,inferior  scarring with peri-infarction ischemis  . Coronary artery bypass graft  1/16/20007    pLIMA -LAD, pRIMA OM-1 ,R-RAD-DIAG,SVG to PD and PL (leaving Ramus on grafted)  - in  . Coronary angioplasty with stent placement  03/18/2005    multiple stents , PTCA followed by at least 2 stents mid and distal RCA,1prox diagonal,1 mid LAD  . Cardiac catheterization  2007    lead to CABG: LAD proximal stent with a 50-60% ISR followed by 70%, high D1 possible stent 70-80% ISR with distal 80%, ramus intermedius  proximal 50-60% ; OM 1 proximal 90% to RCA diffusely ectatic/aneurysmal prroxxiimal 50%, mid 70% with 60% stent ISR in the mid and distal stents, 80% proximal RPL and RPDA.; EF 40-45%, inferior/posterobasal HK  . Nocturnal polysomnogram  2006   No Known Allergies  Current Outpatient Prescriptions  Medication Sig Dispense Refill  . aspirin EC 81 MG tablet Take 81 mg by mouth daily.      . diazepam (VALIUM) 10 MG tablet Take 10 mg by mouth as needed for anxiety.      Marland Kitchen ezetimibe-simvastatin (VYTORIN) 10-40 MG per tablet Take 1 tablet by mouth at bedtime.      . fexofenadine (ALLEGRA) 180 MG tablet Take 180 mg by mouth as needed.       Marland Kitchen lisinopril (PRINIVIL,ZESTRIL) 5 MG tablet Take 5 mg by mouth daily.      . metoprolol succinate (TOPROL-XL) 50 MG 24 hr tablet Take 25 mg by mouth 2 (two) times daily.       . niacin 500 MG tablet Take 500 mg by mouth daily with breakfast.      . Omega 3-6-9 Fatty Acids (OMEGA 3-6-9 COMPLEX) CAPS Take 1 capsule by mouth as needed.      . ranitidine (ZANTAC) 150 MG tablet Take 150 mg by mouth as needed.       . triamterene-hydrochlorothiazide (DYAZIDE) 37.5-25 MG per capsule Take 1 capsule by mouth daily.       No current facility-administered medications for this visit.    Social History Narrative   He is a married father of 2. He is significantly reduced amount of alcohol he drinks, maybe 1 or 2 drinks or so per week. He is a non-Smoker. Despite his dramatic dietary modifications, he really is not as active as he once was. He basically says he is sedentary now, mostly limited by a back and knee pains.    ROS: A comprehensive Review of Systems - Negative except Pertinent positives above and then noncardiac positives below. General ROS: positive for  - weight loss and That is on the negative for - chills, fatigue, fever, hot flashes, malaise, night sweats or sleep disturbance Psychological ROS: positive for - decreased libido negative for - anxiety,  concentration difficulties, depression, obsessive thoughts or sleep disturbances Genito-Urinary ROS: positive for - erectile dysfunction negative for - hematuria or nocturia Musculoskeletal ROS: positive for - Arthritis pains/Mosca skeletal pains in his shoulders and knees as well as hands.  PHYSICAL EXAM BP 90/69  Pulse 80  Ht 5\' 8"  (1.727 m)  Wt 164 lb (74.39 kg)  BMI 24.94 kg/m2 General appearance: alert, cooperative, appears stated age, no distress and Healthy-appearing, well-nourished and well-groomed. Answers questions appropriately Neck: no adenopathy, no carotid bruit and no JVD Lungs: clear to auscultation bilaterally, normal percussion bilaterally and Nonlabored, good air movement. Mild interstitial lung sounds. Heart: regular rate and rhythm, S1, S2 normal, no murmur, click, rub or gallop and normal apical impulse Abdomen:  soft, non-tender; bowel sounds normal; no masses,  no organomegaly Extremities: extremities normal, atraumatic, no cyanosis or edema Pulses: 2+ and symmetric Neurologic: Alert and oriented X 3, normal strength and tone. Normal symmetric reflexes. Normal coordination and gait  WUJ:WJXBJYNWG today: Yes Rate: 80 , Rhythm: Normal sinus, inferior MI, age undetermined.  Recent Labs: None  ASSESSMENT/ PLAN Relatively stable gentleman roughly 7 1/2 years post CABG who is noting some exertional dyspnea. He has not had a cardiac evaluation in over 6 years.  He has done amazingly well his dietary modification having lost just at 20 pounds since his last visit a year ago. It definitely can granulate in on this.  CAD S/P percutaneous coronary angioplasty - then CABG x 5 in Jan 2007 It's been 6 years since his last nuclear stress test. Been having some dyspnea on exertion, I'm concerned that he may very well as a graft dysfunction. He also has the ramus intermedius that was not bypassed.  Plan: Treadmill Myoview (Myocardial Perfusion Imaging)  Cardiomyopathy,  ischemic Has never had an echocardiogram done. His last EF is roughly 40%.  He is on a pretty regimen of beta blocker and ACE inhibitor. No active heart failure symptoms to require any diuretic. He does however have mild dyspnea on exertion.  Plan: 2-D Echocardiogram, continue beta blocker and ACE inhibitor, however while on triamterene/HCTZ, will have him cut the Lisinopril dose in half versus stopped it due to hypotension.  Essential hypertension Actually hypotensive today. He probably can't tolerate Maxzide plus the high dose of ACE inhibitor. ACE inhibitor in half, or even stop while on Maxide, then restart once off Maxide for Mnire's disease.  Dyslipidemia, goal LDL below 70 Currently back on Vytorin along with niacin and fish oil.  Plan: Followup lipid panel, and CMP (chemistries partially due to be on ACE inhibitor and diuretic)  Dyspnea on exertion - more pronounced over past few years Somewhat concerning in a patient with known coronary disease status post CABG and EF of 40%.  Plan: 2-D Echocardiogram, and Treadmill Myoview Stress Test (Myocardial Perfusion Imaging)    Orders Placed This Encounter  Procedures  . Lipid panel    Order Specific Question:  Has the patient fasted?    Answer:  Yes  . Comprehensive metabolic panel    Order Specific Question:  Has the patient fasted?    Answer:  Yes  . Myocardial Perfusion Imaging    Dx ischemic cardiomyopathy,DSYPNEA    Standing Status: Future     Number of Occurrences:      Standing Expiration Date: 10/30/2013    Order Specific Question:  Where should this test be performed    Answer:  [facility]    Order Specific Question:  Type of stress    Answer:  Exercise    Order Specific Question:  Patient weight in lbs    Answer:  164  . EKG 12-Lead  . 2D Echocardiogram without contrast    Standing Status: Future     Number of Occurrences:      Standing Expiration Date: 10/30/2013    Order Specific Question:  Type of  Echo    Answer:  Complete    Order Specific Question:  Where should this test be performed    Answer:  [facility]    Order Specific Question:  Reason for exam-Echo    Answer:  Cardiomyopathy-Ischemic  414.8    Order Specific Question:  Reason for exam-Echo    Answer:  Dyspnea  786.09    Followup: 12 months unless studies show any gross abnormalities.  Ricky Ali, M.D., M.S. [facility] [address] [place], [place]  [address]  [phone] Pager # [phone]

## 2012-10-31 ENCOUNTER — Ambulatory Visit (HOSPITAL_COMMUNITY)
Admission: RE | Admit: 2012-10-31 | Discharge: 2012-10-31 | Disposition: A | Discharge status: Home / Self Care | Payer: Managed Care, Other (non HMO) | Source: Ambulatory Visit | Attending: Cardiovascular Disease | Admitting: Cardiovascular Disease

## 2012-10-31 DIAGNOSIS — R0989 Other specified symptoms and signs involving the circulatory and respiratory systems: Secondary | ICD-10-CM | POA: Insufficient documentation

## 2012-10-31 DIAGNOSIS — I2589 Other forms of chronic ischemic heart disease: Secondary | ICD-10-CM | POA: Insufficient documentation

## 2012-10-31 DIAGNOSIS — I428 Other cardiomyopathies: Secondary | ICD-10-CM

## 2012-10-31 DIAGNOSIS — R0609 Other forms of dyspnea: Secondary | ICD-10-CM | POA: Insufficient documentation

## 2012-10-31 DIAGNOSIS — I10 Essential (primary) hypertension: Secondary | ICD-10-CM | POA: Insufficient documentation

## 2012-10-31 DIAGNOSIS — I2510 Atherosclerotic heart disease of native coronary artery without angina pectoris: Secondary | ICD-10-CM

## 2012-10-31 DIAGNOSIS — I255 Ischemic cardiomyopathy: Secondary | ICD-10-CM

## 2012-10-31 DIAGNOSIS — E785 Hyperlipidemia, unspecified: Secondary | ICD-10-CM | POA: Insufficient documentation

## 2012-10-31 NOTE — Progress Notes (Signed)
[facility]   2D echo completed 10/31/2012.   Cindy Briyan Kleven, RDCS  

## 2012-11-02 ENCOUNTER — Ambulatory Visit (HOSPITAL_COMMUNITY)
Admission: RE | Admit: 2012-11-02 | Discharge: 2012-11-02 | Disposition: A | Discharge status: Home / Self Care | Payer: Managed Care, Other (non HMO) | Source: Ambulatory Visit | Attending: Cardiology | Admitting: Cardiology

## 2012-11-02 DIAGNOSIS — I2510 Atherosclerotic heart disease of native coronary artery without angina pectoris: Secondary | ICD-10-CM | POA: Insufficient documentation

## 2012-11-02 DIAGNOSIS — I2589 Other forms of chronic ischemic heart disease: Secondary | ICD-10-CM | POA: Insufficient documentation

## 2012-11-02 DIAGNOSIS — R42 Dizziness and giddiness: Secondary | ICD-10-CM | POA: Insufficient documentation

## 2012-11-02 DIAGNOSIS — M79609 Pain in unspecified limb: Secondary | ICD-10-CM | POA: Insufficient documentation

## 2012-11-02 DIAGNOSIS — R0609 Other forms of dyspnea: Secondary | ICD-10-CM | POA: Insufficient documentation

## 2012-11-02 DIAGNOSIS — I255 Ischemic cardiomyopathy: Secondary | ICD-10-CM

## 2012-11-02 DIAGNOSIS — Z951 Presence of aortocoronary bypass graft: Secondary | ICD-10-CM | POA: Insufficient documentation

## 2012-11-02 DIAGNOSIS — Z9861 Coronary angioplasty status: Secondary | ICD-10-CM

## 2012-11-02 DIAGNOSIS — I252 Old myocardial infarction: Secondary | ICD-10-CM | POA: Insufficient documentation

## 2012-11-02 DIAGNOSIS — R0989 Other specified symptoms and signs involving the circulatory and respiratory systems: Secondary | ICD-10-CM | POA: Insufficient documentation

## 2012-11-02 DIAGNOSIS — I10 Essential (primary) hypertension: Secondary | ICD-10-CM | POA: Insufficient documentation

## 2012-11-02 DIAGNOSIS — Z87891 Personal history of nicotine dependence: Secondary | ICD-10-CM | POA: Insufficient documentation

## 2012-11-02 DIAGNOSIS — Z8249 Family history of ischemic heart disease and other diseases of the circulatory system: Secondary | ICD-10-CM | POA: Insufficient documentation

## 2012-11-02 LAB — COMPREHENSIVE METABOLIC PANEL
AST: 23 U/L (ref 0–37)
Albumin: 4.4 g/dL (ref 3.5–5.2)
BUN: 28 mg/dL — ABNORMAL HIGH (ref 6–23)
Calcium: 9.4 mg/dL (ref 8.4–10.5)
Chloride: 99 mEq/L (ref 96–112)
Glucose, Bld: 104 mg/dL — ABNORMAL HIGH (ref 70–99)
Potassium: 4.6 mEq/L (ref 3.5–5.3)

## 2012-11-02 LAB — LIPID PANEL
Cholesterol: 135 mg/dL (ref 0–200)
VLDL: 20 mg/dL (ref 0–40)

## 2012-11-02 MED ORDER — TECHNETIUM TC 99M SESTAMIBI GENERIC - CARDIOLITE
30.0000 | Freq: Once | INTRAVENOUS | Status: AC | PRN
  Administered 2012-11-02: 30 via INTRAVENOUS

## 2012-11-02 MED ORDER — TECHNETIUM TC 99M SESTAMIBI GENERIC - CARDIOLITE
10.0000 | Freq: Once | INTRAVENOUS | Status: AC | PRN
  Administered 2012-11-02: 10 via INTRAVENOUS

## 2012-11-02 NOTE — Procedures (Addendum)
[facility] [address] [address] [place] [address] [phone]  Cardiology Nuclear Med Study  Ricky Ali is a 62 y.o. male     MRN : [id number]     DOB: 1950-04-08  Procedure Date: 11/02/2012  Nuclear Med Background Indication for Stress Test:  Evaluation for Ischemia, Graft Patency, Stent Patency and PTCA Patency History:  CAD;MI-1992;CABG X5--04/19/2005;STENT/PTCA--1992, 03/18/2005 AND 2007 Cardiac Risk Factors: Family History - CAD, History of Smoking, Hypertension, Lipids and CARDIOMYOPATHY-ISCHEMIC  Symptoms:  Dizziness, DOE, Light-Headedness, SOB and LEG PAIN   Nuclear Pre-Procedure Caffeine/Decaff Intake:  9:00pm NPO After: 9:00am   IV Site: R Hand  IV 0.9% NS with Angio Cath:  22g  Chest Size (in):  38"  IV Started by: Ricky Pomfret, RN  Height: 5\' 8"  (1.727 m)  Cup Size: n/a  BMI:  Body mass index is 24.94 kg/(m^2). Weight:  164 lb (74.39 kg)   Tech Comments:  N/A    Nuclear Med Study 1 or 2 day study: 1 day  Stress Test Type:  Stress  Order Authorizing Provider:  Bryan Lemma, MD   Resting Radionuclide: Technetium 21m Sestamibi  Resting Radionuclide Dose: 10.4 mCi   Stress Radionuclide:  Technetium 79m Sestamibi  Stress Radionuclide Dose: 31.9 mCi           Stress Protocol Rest HR: 75 Stress HR: 151  Rest BP: 123/88 Stress BP: 158/69  Exercise Time (min): 9 METS: 10.4   Predicted Max HR: 159 bpm % Max HR: 94.97 bpm Rate Pressure Product: 21308  Dose of Adenosine (mg):  n/a Dose of Lexiscan: n/a mg  Dose of Atropine (mg): n/a Dose of Dobutamine: n/a mcg/kg/min (at max HR)  Stress Test Technologist: Ricky Ali, CCT Nuclear Technologist: Ricky Ali, CNMT   Rest Procedure:  Myocardial perfusion imaging was performed at rest 45 minutes following the intravenous administration of Technetium 36m Sestamibi. Stress Procedure:  The patient performed treadmill exercise using a Bruce  Protocol for 9  minutes. The patient stopped due to mild fatigue and denied any chest pain.  There were no significant ST-T wave changes.  Technetium 59m Sestamibi was injected at peak exercise and myocardial perfusion imaging was performed after a brief delay.  Transient Ischemic Dilatation (Normal <1.22):  0.80 Lung/Heart Ratio (Normal <0.45):  0.30 QGS EDV:  96 ml QGS ESV:  59 ml LV Ejection Fraction: 39%  Signed by  Ricky Ali, CNMT  PHYSICIAN INTERPRETATION  Rest ECG: NSR with non-specific ST-T wave changes and Poor Anterior R wave progression.  Stress ECG: No significant change from baseline ECG, No significant ST segment change suggestive of ischemia. and with increased HR, the ST-segment was shortened, giving an appearance of mild STE - not tru STE  QPS Raw Data Images:  Acquisition technically good; normal left ventricular size. Stress Images:  There is decreased uptake in the inferior wall. There is decreased uptake in the septum.There is decreased uptake in the lateral wall.There is a very large (~35% of Myocardium) sized, severe intensity (dense) fixed perfusion defect involving the basal to distal mid Inferior wall, basal inferoseptal wall, and basal to mid Inferolateral wall.  NO reversibility noted. Rest Images:  There is decreased uptake in the inferior wall.  There is decreased uptake in the septum. There is decreased uptake in the lateral wall. Comparison with the stress images reveals no significant change. Subtraction (SDS):  There is a very large fixed inferior, inferolateral-inferoseptal defect that is most consistent with  a previous infarction.   No reversibility is appreciated.  No evidence of ischemia.   Impression Exercise Capacity:  Good exercise capacity. BP Response:  Normal blood pressure response. Clinical Symptoms:  No significant symptoms noted. ECG Impression:  No significant ST segment change suggestive of ischemia. Comparison with Prior Nuclear Study: No significant  change from previous study, but NO suggestion of per-infarction ischemia  Overall Impression:  Low risk stress nuclear study with a stable, previously described large RCA distribution infarction that has no suggestion of per-infarction ischemia.  LV Wall Motion:  Moderately reduced LV function with akinesis of the basal to mid inferior & inferolateral wall and moderate hypkinesis of the distal inferior wall. EF ~39-40% as previously described   Ricky Lex, MD  11/02/2012 2:09 PM

## 2012-11-07 ENCOUNTER — Telehealth: Payer: Self-pay | Admitting: *Deleted

## 2012-11-07 NOTE — Telephone Encounter (Signed)
Message copied by Tobin Chad on Wed Nov 07, 2012 10:51 AM ------      Message from: [facility], DAVID      Created: Wed Oct 31, 2012  9:56 PM       Moderate to severely reduced heart pump function -- by Nuclear ST last time, EF estimated at ~40-45% with inferior scar -- Echo shows that pump function is a bit lower - 35-40% with clear evidence of prior MI.            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        These 2 findings can explain a bit of the tiredness & exertional shortness of breath.            [name], MD             ------

## 2012-11-07 NOTE — Telephone Encounter (Signed)
Results given for labs,echo,myoview. Pt wanted copy of all tests. He stated he will sign up with My Chart.Will release once signed up.

## 2012-11-08 ENCOUNTER — Encounter: Payer: Self-pay | Admitting: *Deleted

## 2013-01-14 ENCOUNTER — Encounter: Payer: Self-pay | Admitting: Cardiology

## 2013-01-30 ENCOUNTER — Other Ambulatory Visit: Payer: Self-pay | Admitting: Cardiology

## 2013-01-30 NOTE — Telephone Encounter (Signed)
Rx was sent to pharmacy electronically. 

## 2013-01-31 ENCOUNTER — Telehealth: Payer: Self-pay | Admitting: Cardiology

## 2013-01-31 NOTE — Telephone Encounter (Signed)
Patient needs new Rx for his mail order pharmacy  [employer] [id number].   Needs Metroprolol ER 50 mg 1/2 tab bid---Vytorin 10/40 1 daily---Lisinporil 5mg   1 daily----patient needs 90 day supply with 3 refills.

## 2013-01-31 NOTE — Telephone Encounter (Signed)
Returned call.  Left message to call back before 4pm and that requests already received from pharmacy and sent back.Marland Kitchen

## 2013-06-06 ENCOUNTER — Telehealth: Payer: Self-pay | Admitting: Cardiology

## 2013-06-06 MED ORDER — EZETIMIBE-SIMVASTATIN 10-40 MG PO TABS: 1.0000 | ORAL_TABLET | Freq: Every day | ORAL | Status: DC

## 2013-06-06 MED ORDER — LISINOPRIL 5 MG PO TABS: 5.0000 mg | ORAL_TABLET | Freq: Every day | ORAL | Status: DC

## 2013-06-06 MED ORDER — METOPROLOL SUCCINATE ER 50 MG PO TB24: ORAL_TABLET | ORAL | Status: DC

## 2013-06-06 NOTE — Telephone Encounter (Signed)
Need new prescriptions for hid Metoprolol 50 mg,Lisinipril 5 mg and Vytorin 10/40 mg. [phone].

## 2013-06-06 NOTE — Telephone Encounter (Signed)
Rx was sent to pharmacy electronically. 

## 2013-12-24 ENCOUNTER — Telehealth: Payer: Self-pay | Admitting: Cardiology

## 2013-12-24 MED ORDER — METOPROLOL SUCCINATE ER 50 MG PO TB24: ORAL_TABLET | ORAL | Status: DC

## 2013-12-24 MED ORDER — EZETIMIBE-SIMVASTATIN 10-40 MG PO TABS: 1.0000 | ORAL_TABLET | Freq: Every day | ORAL | Status: DC

## 2013-12-24 MED ORDER — LISINOPRIL 5 MG PO TABS: 5.0000 mg | ORAL_TABLET | Freq: Every day | ORAL | Status: DC

## 2013-12-24 NOTE — Telephone Encounter (Signed)
SPOKE  TO PATIENT  HE WANTED 30 DAY SUPPLY UNTIL HIS APPOINTMENT ON 10 /22/15  E-SENT MEDICATION PHARMACY

## 2013-12-24 NOTE — Telephone Encounter (Signed)
Pt called in stating that he would like to get his Vytorin,Lisinopril,and Metoprolol called in to the [facility] on [id number] Please call  Thanks

## 2013-12-25 NOTE — Telephone Encounter (Signed)
Close encounter 

## 2014-01-16 ENCOUNTER — Telehealth: Payer: Self-pay | Admitting: Cardiology

## 2014-01-17 NOTE — Telephone Encounter (Signed)
Close encounter 

## 2014-01-23 ENCOUNTER — Ambulatory Visit: Payer: Managed Care, Other (non HMO) | Admitting: Cardiology

## 2014-01-24 ENCOUNTER — Ambulatory Visit (INDEPENDENT_AMBULATORY_CARE_PROVIDER_SITE_OTHER): Payer: BC Managed Care – PPO | Admitting: Cardiology

## 2014-01-24 ENCOUNTER — Encounter: Payer: Self-pay | Admitting: Cardiology

## 2014-01-24 VITALS — BP 138/80 | HR 57 | Ht 69.0 in | Wt 172.0 lb

## 2014-01-24 DIAGNOSIS — I2510 Atherosclerotic heart disease of native coronary artery without angina pectoris: Secondary | ICD-10-CM

## 2014-01-24 DIAGNOSIS — Z9861 Coronary angioplasty status: Secondary | ICD-10-CM

## 2014-01-24 DIAGNOSIS — E785 Hyperlipidemia, unspecified: Secondary | ICD-10-CM

## 2014-01-24 DIAGNOSIS — I10 Essential (primary) hypertension: Secondary | ICD-10-CM

## 2014-01-24 DIAGNOSIS — I2119 ST elevation (STEMI) myocardial infarction involving other coronary artery of inferior wall: Secondary | ICD-10-CM | POA: Insufficient documentation

## 2014-01-24 DIAGNOSIS — R0609 Other forms of dyspnea: Secondary | ICD-10-CM

## 2014-01-24 DIAGNOSIS — I255 Ischemic cardiomyopathy: Secondary | ICD-10-CM

## 2014-01-24 MED ORDER — EZETIMIBE-SIMVASTATIN 10-40 MG PO TABS: 1.0000 | ORAL_TABLET | Freq: Every day | ORAL | Status: AC

## 2014-01-24 MED ORDER — LISINOPRIL 5 MG PO TABS: 5.0000 mg | ORAL_TABLET | Freq: Every day | ORAL | Status: AC

## 2014-01-24 MED ORDER — NITROGLYCERIN 0.4 MG SL SUBL: 0.4000 mg | SUBLINGUAL_TABLET | SUBLINGUAL | Status: AC | PRN

## 2014-01-24 MED ORDER — METOPROLOL SUCCINATE ER 50 MG PO TB24: ORAL_TABLET | ORAL | Status: AC

## 2014-01-24 NOTE — Progress Notes (Signed)
PCP: Delorse Lek, MD  Clinic Note: Chief Complaint  Patient presents with  . Annual Exam    sore throat, no chest pain, no sob , no edema, mucsle stiffness, labs done at pcp    HPI: Ricky Ali is a 63 y.o. male with a PMH below who presents today for a one-year followup..  Past Medical History  Diagnosis Date  . H/O ST elevation myocardial infarction (STEMI) of inferior wall, subsequent episode of care 58 - age 76    PTCA of RCA occlusion  . CAD S/P percutaneous coronary angioplasty 2006    '92: Large Inf MI- RCA PTCA; a)03/2005 - 2 stents to m&d RCA, 1 prox D1, 1 mid LAD; b) 11/2012 - LOW RISK, stable RCA MI with AK of mid Inf & Inf-lat wall, mod HK of distal Inf wall. EF ~40% -- No ISCHEMIA  . 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 Cardiomyopathy, ischemic 10/1990    a) 2008: EF ~ 40% by Myoview; b) 10/2012 Echo: EF 35-40%, Entire Inferior & inferoseptal dyskinesis c/w known MI, mild MR & MVP   . Essential hypertension 10/31/1990  . Hyperlipidemia with target LDL less than 70   . Seasonal allergies   . Meniere's disease July 2014     recurrent    Interval History: [name] is a very pleasant gentleman with a long history of coronary disease in 1992 he suffered a massive inferior STEMI treated with PTCA of the RCA. He has had multiple angioplasties and PCIS since. He finally went for CABG in 2007. His most recent nuclear stress test was last year that was negative for ischemia but hasn't sizable infarction noted.  Today he comes in for somewhat delayed annual followup without any major cardiac complaints. He is relatively active and denies any sensation with rest or exertion of chest tightness pressure or dyspnea. He says is hard to really get a dedicated time to do a true exercise regimen just with time he spends at work. He denies any PND, orthopnea or edema. He is getting over cold suite chest congestion and coughing a  little over dyspnea from that but probably more related to allergies and cold than anything else.  He notes as tightness in his neck upper back and shoulders that was similar to what it felt when he had been on Lipitor years ago before switching to Vytorin.  No palpitations, lightheadedness, dizziness, weakness or syncope/near syncope. No TIA/amaurosis fugax symptoms. No melena, hematochezia, hematuria, or epstaxis. No claudication.  ROS: A comprehensive was performed. Review of Systems  Constitutional: Negative for fever, chills and weight loss.  HENT: Positive for congestion and sore throat.   Respiratory: Positive for cough. Negative for hemoptysis, sputum production, shortness of breath and wheezing.   Cardiovascular: Negative for claudication.  Gastrointestinal: Negative for blood in stool and melena.       But occasional Dark Stools  Genitourinary: Negative for hematuria.  Musculoskeletal: Positive for back pain and neck pain.       Cramping  Neurological: Negative for dizziness, sensory change, speech change, focal weakness, seizures and loss of consciousness.  Endo/Heme/Allergies: Does not bruise/bleed easily.  Psychiatric/Behavioral: Negative for depression. The patient is not nervous/anxious.   All other systems reviewed and are negative.   Current Outpatient Prescriptions on File Prior to Visit  Medication Sig Dispense Refill  . aspirin EC 81 MG tablet Take 81 mg by mouth daily.      Marland Kitchen  diazepam (VALIUM) 10 MG tablet Take 10 mg by mouth as needed for anxiety.      . fexofenadine (ALLEGRA) 180 MG tablet Take 180 mg by mouth as needed.       . niacin 500 MG tablet Take 500 mg by mouth as needed.       . Omega 3-6-9 Fatty Acids (OMEGA 3-6-9 COMPLEX) CAPS Take 1 capsule by mouth as needed.      . ranitidine (ZANTAC) 150 MG tablet Take 150 mg by mouth as needed.       . triamterene-hydrochlorothiazide (DYAZIDE) 37.5-25 MG per capsule Take 1 capsule by mouth daily.       No  current facility-administered medications on file prior to visit.   ALLERGIES REVIEWED IN EPIC -- No change SOCIAL AND FAMILY HISTORY REVIEWED IN EPIC -- NO change  Wt Readings from Last 3 Encounters:  01/24/14 172 lb (78.019 kg)  11/02/12 164 lb (74.39 kg)  10/30/12 164 lb (74.39 kg)    PHYSICAL EXAM BP 138/80  Pulse 57  Ht 5\' 9"  (1.753 m)  Wt 172 lb (78.019 kg)  BMI 25.39 kg/m2 General appearance: alert, cooperative, appears stated age, no distress and Healthy-appearing, well-nourished and well-groomed. Answers questions appropriately  Neck: no adenopathy, no carotid bruit and no JVD  Lungs: clear to auscultation bilaterally, normal percussion bilaterally and Nonlabored, good air movement. Mild interstitial lung sounds.  Heart: regular rate and rhythm, S1, normal, split S2, no murmur, click, rub or gallop and normal apical impulse  Abdomen: soft, non-tender; bowel sounds normal; no masses, no organomegaly  Extremities: extremities normal, atraumatic, no cyanosis or edema  Pulses: 2+ and symmetric  Neurologic: Alert and oriented X 3, normal strength and tone. Normal symmetric reflexes. Normal coordination and gait    Adult ECG Report  Rate: 57 ;  Rhythm: sinus bradycardia - subtle anteroseptal Q waves suggestive of possible anteroseptal MI, age undetermined. determined. Less apparent inferior Q waves suggesting an inferior MI, age undetermined.  Narrative Interpretation: Relatively stable EKG  Recent Labs:    Lipids 07/2013: TC 158, TG 127, HDL, 41, LDL 92, VLDL 25, Non-HDL 117  ASSESSMENT / PLAN: H/O ST elevation myocardial infarction (STEMI) of inferior wall, subsequent episode of care Large inferior scar on echo and nuclear.  I would not be surprised if this region has no viability. Thankfully his EF has remained relatively stable for the last 10 years or so. No further MI symptoms.  CAD S/P percutaneous coronary angioplasty - then CABG x 5 in Jan 2007 No active anginal  symptoms. Had an MI last year. In the absence of any new changes, symptoms or EKG changes, am not inclined to consider pursuing another ischemic evaluation for this in the 4-5 years. He is on aspirin statin beta blocker ACE inhibitor without major symptoms. We'll give him a prescription for when necessary nitroglycerin.  Cardiomyopathy, ischemic We finally had an echocardiogram which confirmed consultants nuclear stress tests within normal valvular function an ejection fraction of roughly 35-40%. Otherwise is very stable regimen with Toprol and lisinopril. No need for loop diuretic or spironolactone.  Dyslipidemia, goal LDL below 70 On Vytorin. The condyle from his PCP shows that his LDL is 92, was 81 last year. I would like her to stay on a statin as severe. I recommended starting Coenzyme Q 10 and titrate up to 300 mg daily.  Essential hypertension Blood pressures are stable today on current regimen. No major changes. He remains on Maxzide for Mnire's disease.  Dyspnea on exertion - more pronounced over past few years He didn't note this is an issue today.   Orders Placed This Encounter  Procedures  . EKG 12-Lead   Refills Meds ordered this encounter  Medications  . ezetimibe-simvastatin (VYTORIN) 10-40 MG per tablet    Sig: Take 1 tablet by mouth daily at 6 PM.    Dispense:  90 tablet    Refill:  3  . lisinopril (PRINIVIL,ZESTRIL) 5 MG tablet    Sig: Take 1 tablet (5 mg total) by mouth daily.    Dispense:  90 tablet    Refill:  3  . metoprolol succinate (TOPROL-XL) 50 MG 24 hr tablet    Sig: Take 1/2 tablet by mouth twice daily. Take with or immediately following a meal.    Dispense:  90 tablet    Refill:  3  . nitroGLYCERIN (NITROSTAT) 0.4 MG SL tablet    Sig: Place 1 tablet (0.4 mg total) under the tongue every 5 (five) minutes as needed for chest pain.    Dispense:  25 tablet    Refill:  5   Followup: 1 yr   HARDING,DAVID W, M.D., M.S. Interventional Cardiologist    Pager # [phone]

## 2014-01-24 NOTE — Assessment & Plan Note (Signed)
No active anginal symptoms. Had an MI last year. In the absence of any new changes, symptoms or EKG changes, am not inclined to consider pursuing another ischemic evaluation for this in the 4-5 years. He is on aspirin statin beta blocker ACE inhibitor without major symptoms. We'll give him a prescription for when necessary nitroglycerin.

## 2014-01-24 NOTE — Patient Instructions (Signed)
May use CoQ-10 for cramps. Up to 300 mg three times a day   Prescription sent to pharmacy   Your physician wants you to follow-up in 12 months Dr Herbie BaltimoreHarding.  You will receive a reminder letter in the mail two months in advance. If you don't receive a letter, please call our office to schedule the follow-up appointment.

## 2014-01-24 NOTE — Assessment & Plan Note (Signed)
We finally had an echocardiogram which confirmed consultants nuclear stress tests within normal valvular function an ejection fraction of roughly 35-40%. Otherwise is very stable regimen with Toprol and lisinopril. No need for loop diuretic or spironolactone.

## 2014-01-24 NOTE — Assessment & Plan Note (Signed)
Large inferior scar on echo and nuclear.  I would not be surprised if this region has no viability. Thankfully his EF has remained relatively stable for the last 10 years or so. No further MI symptoms.

## 2014-01-24 NOTE — Assessment & Plan Note (Addendum)
Blood pressures are stable today on current regimen. No major changes. He remains on Maxzide for Mnire's disease.

## 2014-01-24 NOTE — Assessment & Plan Note (Signed)
On Vytorin. The condyle from his PCP shows that his LDL is 92, was 81 last year. I would like her to stay on a statin as severe. I recommended starting Coenzyme Q 10 and titrate up to 300 mg daily.

## 2014-01-24 NOTE — Assessment & Plan Note (Signed)
He didn't note this is an issue today.

## 2014-03-23 ENCOUNTER — Emergency Department (HOSPITAL_COMMUNITY)
Admission: EM | Admit: 2014-03-23 | Discharge: 2014-04-04 | Disposition: E | Payer: BC Managed Care – PPO | Attending: Emergency Medicine | Admitting: Emergency Medicine

## 2014-03-23 DIAGNOSIS — E785 Hyperlipidemia, unspecified: Secondary | ICD-10-CM | POA: Insufficient documentation

## 2014-03-23 DIAGNOSIS — Z7982 Long term (current) use of aspirin: Secondary | ICD-10-CM | POA: Insufficient documentation

## 2014-03-23 DIAGNOSIS — X58XXXA Exposure to other specified factors, initial encounter: Secondary | ICD-10-CM | POA: Insufficient documentation

## 2014-03-23 DIAGNOSIS — Y9389 Activity, other specified: Secondary | ICD-10-CM | POA: Insufficient documentation

## 2014-03-23 DIAGNOSIS — Z9861 Coronary angioplasty status: Secondary | ICD-10-CM | POA: Insufficient documentation

## 2014-03-23 DIAGNOSIS — Z79899 Other long term (current) drug therapy: Secondary | ICD-10-CM | POA: Diagnosis not present

## 2014-03-23 DIAGNOSIS — Y998 Other external cause status: Secondary | ICD-10-CM | POA: Diagnosis not present

## 2014-03-23 DIAGNOSIS — Z87891 Personal history of nicotine dependence: Secondary | ICD-10-CM | POA: Diagnosis not present

## 2014-03-23 DIAGNOSIS — F329 Major depressive disorder, single episode, unspecified: Secondary | ICD-10-CM | POA: Diagnosis not present

## 2014-03-23 DIAGNOSIS — Y92017 Garden or yard in single-family (private) house as the place of occurrence of the external cause: Secondary | ICD-10-CM | POA: Insufficient documentation

## 2014-03-23 DIAGNOSIS — I10 Essential (primary) hypertension: Secondary | ICD-10-CM | POA: Diagnosis not present

## 2014-03-23 DIAGNOSIS — I2510 Atherosclerotic heart disease of native coronary artery without angina pectoris: Secondary | ICD-10-CM | POA: Insufficient documentation

## 2014-03-23 DIAGNOSIS — I214 Non-ST elevation (NSTEMI) myocardial infarction: Secondary | ICD-10-CM | POA: Insufficient documentation

## 2014-03-23 DIAGNOSIS — S0081XA Abrasion of other part of head, initial encounter: Secondary | ICD-10-CM | POA: Diagnosis not present

## 2014-03-23 DIAGNOSIS — I469 Cardiac arrest, cause unspecified: Secondary | ICD-10-CM | POA: Diagnosis not present

## 2014-03-23 DIAGNOSIS — Z951 Presence of aortocoronary bypass graft: Secondary | ICD-10-CM | POA: Insufficient documentation

## 2014-03-23 MED ORDER — EPINEPHRINE HCL 0.1 MG/ML IJ SOSY
PREFILLED_SYRINGE | INTRAMUSCULAR | Status: AC | PRN
  Administered 2014-03-23: 1 via INTRAVENOUS

## 2014-03-23 MED ORDER — SODIUM BICARBONATE 8.4 % IV SOLN
INTRAVENOUS | Status: AC | PRN
  Administered 2014-03-23: 50 meq via INTRAVENOUS

## 2014-03-24 MED FILL — Medication: Qty: 1 | Status: AC

## 2014-04-04 NOTE — ED Notes (Addendum)
Daughter arrived and visited patient. All of the patient's belongings were sent with the family. Patient placement called and informed patient was ready for transport to the morgue. Patient placement stated they would call the funeral home.

## 2014-04-04 NOTE — Code Documentation (Addendum)
Pulse check. Asystole. No pulse.

## 2014-04-04 NOTE — Code Documentation (Addendum)
[place]. Compressions started.

## 2014-04-04 NOTE — ED Notes (Signed)
[facility] called and asked that patient be put back on ventilation to harvest organs.  Katlyn with [facility] asked for vent settings to be  Tidal Vol 10cc/kg 11 breaths/minute fi02 100%  Base off ideal body weight.  Call back number [phone]

## 2014-04-04 NOTE — Code Documentation (Signed)
Patient time of death occurred at [id number].

## 2014-04-04 NOTE — ED Notes (Signed)
Spoke with patient placement who stated that they had all of the information they need prior to the patient being transported to the morgue. Waiting on one family member to arrive to view patient prior to transport to the morgue per family request. Will call patient placement when ready to transport.

## 2014-04-04 NOTE — ED Notes (Signed)
Dr. Park Popeockerty at bedside and CPR innitiated at [phone].

## 2014-04-04 NOTE — Progress Notes (Signed)
Chaplain assisted in escorting family to consult room B.  Son arrived, then wife, daughter-in-law and various other family members through the night.  Wife and son were inconsolable.  Wife felt she should have checked on patient earlier; however, Doctor Micheline MazeDocherty assured her that there was most likely nothing she could have done and patient's death was very likely swift.  Assisted nurse in gathering information.  Escorted family to view the deceased. Offered support, answered questions and educated family about the process.  Family declined prayer.  They decided to stay while a daughter drove in from [place].  Chaplain appreciated Doctor's Docherty's excellent bedside manner with family.    Theodoro Parmaalacios, Taveon Enyeart N, Chaplain [phone]    02/07/14 2200  Clinical Encounter Type  Visited With Family  Visit Type Death  Referral From (ED/CPR in progress)  Consult/Referral To Chaplain  Spiritual Encounters  Spiritual Needs Grief support  Stress Factors  Family Stress Factors Major life changes

## 2014-04-04 NOTE — Code Documentation (Signed)
Code called by Dr. Micheline Mazeocherty.

## 2014-04-04 NOTE — ED Notes (Signed)
ET tube removed with permission from Dr. Micheline Mazeocherty. Chaplain bringing family to bedside.

## 2014-04-04 NOTE — Code Documentation (Signed)
Pulse check. No pulse.

## 2014-04-04 NOTE — ED Provider Notes (Signed)
CSN: [id number]     Arrival date & time 01/24/2014  1734 History   First MD Initiated Contact with Patient 01/24/2014 1748     Chief Complaint  Patient presents with  . Cardiac Arrest     (Consider location/radiation/quality/duration/timing/severity/associated sxs/prior Treatment) Patient is a 64 y.o. male presenting with neurologic complaint and altered mental status. The history is provided by the patient. The history is limited by the condition of the patient. No language interpreter was used.  Neurologic Problem  Altered Mental Status Presenting symptoms: unresponsiveness   Severity:  Severe Episode history:  Single Duration: was unresponsive upon EMS arrial approx 40 mins PTA. Timing:  Constant Progression:  Unchanged Chronicity:  New Context comment:  Outsode doing yard work, wife found him laying on ground outside Associated symptoms: depression     Past Medical History  Diagnosis Date  . H/O ST elevation myocardial infarction (STEMI) of inferior wall, subsequent episode of care [id number] - age 64    PTCA of RCA occlusion  . CAD S/P percutaneous coronary angioplasty 2006    '92: Large Inf MI- RCA PTCA; a)03/2005 - 2 stents to m&d RCA, 1 prox D1, 1 mid LAD; b) 11/2012 - LOW RISK, stable RCA MI with AK of mid Inf & Inf-lat wall, mod HK of distal Inf wall. EF ~40% -- No ISCHEMIA  . 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 Cardiomyopathy, ischemic 10/1990    a) 2008: EF ~ 40% by Myoview; b) 10/2012 Echo: EF 35-40%, Entire Inferior & inferoseptal dyskinesis c/w known MI, mild MR & MVP   . Essential hypertension 10/31/1990  . Hyperlipidemia with target LDL less than 70   . Seasonal allergies   . Meniere's disease July 2014     recurrent   Past Surgical History  Procedure Laterality Date  . Nm myocar perf wall motion  02/08/2007    EF 43% ,inferior scarring with peri-infarction ischemis  . Coronary artery bypass graft  1/16/20007    pLIMA  -LAD, pRIMA OM-1 ,R-RAD-DIAG,SVG to PD and PL (leaving Ramus on grafted)  - in  . Coronary angioplasty with stent placement  03/18/2005    multiple stents , PTCA followed by at least 2 stents mid and distal RCA,1prox diagonal,1 mid LAD  . Cardiac catheterization  2007    lead to CABG: LAD proximal stent with a 50-60% ISR followed by 70%, high D1 possible stent 70-80% ISR with distal 80%, ramus intermedius proximal 50-60% ; OM 1 proximal 90% to RCA diffusely ectatic/aneurysmal prroxxiimal 50%, mid 70% with 60% stent ISR in the mid and distal stents, 80% proximal RPL and RPDA.; EF 40-45%, inferior/posterobasal HK  . Nocturnal polysomnogram  2006   Family History  Problem Relation Age of Onset  . Liver cancer Brother    History  Substance Use Topics  . Smoking status: Former Smoker    Quit date: 01/24/1974  . Smokeless tobacco: Not on file  . Alcohol Use: No    Review of Systems  Unable to perform ROS: Patient unresponsive      Allergies  Lipitor  Home Medications   Prior to Admission medications   Medication Sig Start Date End Date Taking? Authorizing Provider  aspirin EC 81 MG tablet Take 81 mg by mouth daily.    Historical Provider, MD  diazepam (VALIUM) 10 MG tablet Take 10 mg by mouth as needed for anxiety.    Historical Provider, MD  ezetimibe-simvastatin (VYTORIN) 10-40 MG  per tablet Take 1 tablet by mouth daily at 6 PM. 01/24/14   Marykay Lexavid W Harding, MD  fexofenadine (ALLEGRA) 180 MG tablet Take 180 mg by mouth as needed.     Historical Provider, MD  lisinopril (PRINIVIL,ZESTRIL) 5 MG tablet Take 1 tablet (5 mg total) by mouth daily. 01/24/14   Marykay Lexavid W Harding, MD  metoprolol succinate (TOPROL-XL) 50 MG 24 hr tablet Take 1/2 tablet by mouth twice daily. Take with or immediately following a meal. 01/24/14   Marykay Lexavid W Harding, MD  niacin 500 MG tablet Take 500 mg by mouth as needed.     Historical Provider, MD  nitroGLYCERIN (NITROSTAT) 0.4 MG SL tablet Place 1 tablet (0.4 mg  total) under the tongue every 5 (five) minutes as needed for chest pain. 01/24/14   Marykay Lexavid W Harding, MD  Omega 3-6-9 Fatty Acids (OMEGA 3-6-9 COMPLEX) CAPS Take 1 capsule by mouth as needed.    Historical Provider, MD  ranitidine (ZANTAC) 150 MG tablet Take 150 mg by mouth as needed.     Historical Provider, MD  triamterene-hydrochlorothiazide (DYAZIDE) 37.5-25 MG per capsule Take 1 capsule by mouth daily. 09/19/12   Historical Provider, MD   There were no vitals taken for this visit. Physical Exam  Constitutional:  Pale, cyanosis of head/face  HENT:  Small pinpoint abrasion to central forehead  Eyes:  Dilated, fixed, corneas cloudy  Cardiovascular:  PEA  Pulmonary/Chest:  No effort, intubated  Abdominal: Soft.  Neurological:  GCS 3  Skin:  cold    ED Course  Procedures (including critical care time) Labs Review Labs Reviewed - No data to display  Imaging Review No results found.   EKG Interpretation None      Cardiopulmonary Resuscitation (CPR) Procedure Note Directed/Performed by: Toy CookeyHERTY, MEGAN, E I personally directed ancillary staff and/or performed CPR in an effort to regain return of spontaneous circulation and to maintain cardiac, neuro and systemic perfusion.    MDM   Final diagnoses:  Cardiopulmonary arrest  Non-ST elevation (NSTEMI) myocardial infarction    Pt is a 64 y.o. male with Pmhx as above who presents as CPR in progess.  Per EMS, patient was outside chopping wood with a chain saw.  Wife was no longer able to hear chainsaw went out to check on him and found him unresponsive, lying on the ground.  He appeared to be cutting wood on the ground and did not appear to have been up in a tree.  CPR initiated by wife, continued by fire before EMS arrived.  At time of arrival, CPR had been ongoing for approximately 40 minutes.  7 rounds of epinephrine had been given, as well as an amp of D50 in approximately his cc of IV fluids.  Patient is cold, pale.  Pupils  are fixed and dilated.  Corneas are cloudy.  CPR was continued in the ED.  Additional 2 doses of epi and amp of bicarbonate was given.  Patient remained in PEA during the entirety of the code.  Brief bedside ultrasound done which showed no cardiac activity.  Time of death called at 1742 PM. I Spoke w/ ME, we agree this is not an ME case.   Given past medical history, I suspect a myocardial infarction as cause of death.  I have filed death certificate stating such.   Toy CookeyMegan Docherty, MD 05/23/2013 [phone]

## 2014-04-04 DEATH — deceased

## 2014-04-10 ENCOUNTER — Telehealth: Payer: Self-pay | Admitting: Cardiology

## 2014-04-10 NOTE — Telephone Encounter (Signed)
Wife said she need to talk to you,pt passed away 2013/10/20.

## 2014-04-11 NOTE — Telephone Encounter (Signed)
Spoke to wife  RN GAVE CONDOLENCE to wife  Wife wanted to know if anything different about patient's appointment. She states he had appointment @2  months ago, and was gone in DEC. RN states the notes did not indicate anything that require follow up. Wife said thanks,condolence given again.
